# Patient Record
Sex: Female | Born: 1980 | Race: Black or African American | Hispanic: No | Marital: Single | Smoking: Former smoker
Health system: Southern US, Community
[De-identification: ages and names within clinical notes are randomized; demographics above are authoritative.]

## PROBLEM LIST (undated history)

## (undated) ENCOUNTER — Inpatient Hospital Stay (HOSPITAL_COMMUNITY): Payer: Self-pay

## (undated) DIAGNOSIS — F410 Panic disorder [episodic paroxysmal anxiety] without agoraphobia: Secondary | ICD-10-CM

## (undated) DIAGNOSIS — I10 Essential (primary) hypertension: Secondary | ICD-10-CM

## (undated) DIAGNOSIS — J45909 Unspecified asthma, uncomplicated: Secondary | ICD-10-CM

## (undated) DIAGNOSIS — F319 Bipolar disorder, unspecified: Secondary | ICD-10-CM

---
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
## 2002-05-25 ENCOUNTER — Other Ambulatory Visit: Admission: RE | Admit: 2002-05-25 | Discharge: 2002-05-25 | Payer: Self-pay | Admitting: Obstetrics and Gynecology

## 2002-07-29 ENCOUNTER — Emergency Department (HOSPITAL_COMMUNITY): Admission: EM | Admit: 2002-07-29 | Discharge: 2002-07-29 | Payer: Self-pay | Admitting: Emergency Medicine

## 2002-08-10 ENCOUNTER — Emergency Department (HOSPITAL_COMMUNITY): Admission: EM | Admit: 2002-08-10 | Discharge: 2002-08-10 | Payer: Self-pay | Admitting: Emergency Medicine

## 2002-08-10 ENCOUNTER — Encounter: Payer: Self-pay | Admitting: Emergency Medicine

## 2003-02-18 ENCOUNTER — Emergency Department (HOSPITAL_COMMUNITY): Admission: EM | Admit: 2003-02-18 | Discharge: 2003-02-18 | Payer: Self-pay | Admitting: Emergency Medicine

## 2003-09-27 ENCOUNTER — Emergency Department (HOSPITAL_COMMUNITY): Admission: EM | Admit: 2003-09-27 | Discharge: 2003-09-27 | Payer: Self-pay | Admitting: Emergency Medicine

## 2006-02-04 ENCOUNTER — Observation Stay (HOSPITAL_COMMUNITY): Admission: EM | Admit: 2006-02-04 | Discharge: 2006-02-05 | Payer: Self-pay | Admitting: Emergency Medicine

## 2006-02-05 ENCOUNTER — Inpatient Hospital Stay (HOSPITAL_COMMUNITY): Admission: RE | Admit: 2006-02-05 | Discharge: 2006-02-10 | Payer: Self-pay | Admitting: Psychiatry

## 2006-02-06 ENCOUNTER — Ambulatory Visit: Payer: Self-pay | Admitting: Psychiatry

## 2006-04-28 ENCOUNTER — Inpatient Hospital Stay (HOSPITAL_COMMUNITY): Admission: AD | Admit: 2006-04-28 | Discharge: 2006-04-28 | Payer: Self-pay | Admitting: Gynecology

## 2006-06-05 ENCOUNTER — Other Ambulatory Visit: Admission: RE | Admit: 2006-06-05 | Discharge: 2006-06-05 | Payer: Self-pay | Admitting: Obstetrics and Gynecology

## 2006-08-09 ENCOUNTER — Inpatient Hospital Stay (HOSPITAL_COMMUNITY): Admission: AD | Admit: 2006-08-09 | Discharge: 2006-08-09 | Payer: Self-pay | Admitting: Obstetrics and Gynecology

## 2006-11-06 ENCOUNTER — Inpatient Hospital Stay (HOSPITAL_COMMUNITY): Admission: AD | Admit: 2006-11-06 | Discharge: 2006-11-06 | Payer: Self-pay | Admitting: Obstetrics and Gynecology

## 2006-11-12 ENCOUNTER — Inpatient Hospital Stay (HOSPITAL_COMMUNITY): Admission: AD | Admit: 2006-11-12 | Discharge: 2006-11-13 | Payer: Self-pay | Admitting: Obstetrics and Gynecology

## 2006-11-14 ENCOUNTER — Inpatient Hospital Stay (HOSPITAL_COMMUNITY): Admission: AD | Admit: 2006-11-14 | Discharge: 2006-11-17 | Payer: Self-pay | Admitting: Obstetrics and Gynecology

## 2006-11-20 ENCOUNTER — Inpatient Hospital Stay (HOSPITAL_COMMUNITY): Admission: AD | Admit: 2006-11-20 | Discharge: 2006-11-20 | Payer: Self-pay | Admitting: Obstetrics and Gynecology

## 2008-01-17 ENCOUNTER — Emergency Department (HOSPITAL_COMMUNITY): Admission: EM | Admit: 2008-01-17 | Discharge: 2008-01-18 | Payer: Self-pay | Admitting: Emergency Medicine

## 2009-04-05 ENCOUNTER — Emergency Department (HOSPITAL_BASED_OUTPATIENT_CLINIC_OR_DEPARTMENT_OTHER): Admission: EM | Admit: 2009-04-05 | Discharge: 2009-04-05 | Payer: Self-pay | Admitting: Emergency Medicine

## 2009-04-05 ENCOUNTER — Ambulatory Visit: Payer: Self-pay | Admitting: Diagnostic Radiology

## 2009-04-18 ENCOUNTER — Ambulatory Visit: Payer: Self-pay | Admitting: *Deleted

## 2009-04-18 ENCOUNTER — Inpatient Hospital Stay (HOSPITAL_COMMUNITY): Admission: RE | Admit: 2009-04-18 | Discharge: 2009-04-21 | Payer: Self-pay | Admitting: *Deleted

## 2010-03-15 ENCOUNTER — Emergency Department (HOSPITAL_COMMUNITY): Admission: EM | Admit: 2010-03-15 | Discharge: 2010-03-15 | Payer: Self-pay | Admitting: Emergency Medicine

## 2010-12-16 LAB — RAPID STREP SCREEN (MED CTR MEBANE ONLY): Streptococcus, Group A Screen (Direct): NEGATIVE

## 2010-12-16 LAB — STREP A DNA PROBE: Group A Strep Probe: NEGATIVE

## 2011-01-06 LAB — COMPREHENSIVE METABOLIC PANEL
Albumin: 4.3 g/dL (ref 3.5–5.2)
BUN: 11 mg/dL (ref 6–23)
Chloride: 107 mEq/L (ref 96–112)
Creatinine, Ser: 1.08 mg/dL (ref 0.4–1.2)
GFR calc non Af Amer: >60 mL/min (ref >60)
Total Bilirubin: 1.5 mg/dL — ABNORMAL HIGH (ref 0.3–1.2)

## 2011-01-06 LAB — TSH
TSH: 1.136 u[IU]/mL (ref 0.350–4.500)
TSH: 1.498 u[IU]/mL (ref 0.350–4.500)

## 2011-01-06 LAB — CBC
HCT: 40.2 % (ref 36.0–46.0)
MCV: 86 fL (ref 78.0–100.0)
Platelets: 220 10*3/uL (ref 150–400)
WBC: 7.3 10*3/uL (ref 4.0–10.5)

## 2011-01-06 LAB — URINALYSIS, ROUTINE W REFLEX MICROSCOPIC
Ketones, ur: NEGATIVE mg/dL
Nitrite: NEGATIVE
Protein, ur: NEGATIVE mg/dL
Urobilinogen, UA: 1 mg/dL (ref 0.0–1.0)
pH: 6 (ref 5.0–8.0)

## 2011-02-12 NOTE — Discharge Summary (Signed)
NAMEMALORY, [name]:  [id number]   MEDICAL RECORD NO.:  [id number]          PATIENT TYPE:  IPS   LOCATION:  0307                          FACILITY:  [facility]   PHYSICIAN:  Jasmine Pang, M.D. DATE OF BIRTH:  [birth year]   DATE OF ADMISSION:  04/18/2009  DATE OF DISCHARGE:  04/21/2009                               DISCHARGE SUMMARY   IDENTIFICATION:  This is a 30 year old separated African American female  from [place] who was admitted on a voluntary basis on April 18, 2009.   HISTORY OF PRESENT ILLNESS:  The patient was admitted to seek treatment  for depression and anxiety.  She reported worsening depression and  severe anxiety with panic attacks for the past 2 months.  She stated  that her major stressors at this time include job stress, a stress of  being a single parent, her brother's recent diagnosis of brain tumor,  and her father's terminal illness.  She states she recently used  marijuana and alcohol, but this is not a routine for her.  For further  admission information, see psychiatric admission assessment.   PHYSICAL FINDINGS:  There were no acute physical or medical problems  noted.   ADMISSION LABORATORY:  The TSH was within normal limits at 1.498.  Comprehensive metabolic panel was within normal limits except for a  slightly elevated total bilirubin of 1.5 (0.3-1.2) and hers was 1.5.  CBC was grossly within normal limits.   HOSPITAL COURSE:  Upon admission, the patient was started on Ambien 5 mg  p.o. q.h.s. p.r.n. insomnia, may repeat x1 if needed.  She was also  started on Ensure 1 can at 10 a.m., 1400 hours, and 2000 hours.  In  individual sessions, the patient had psychomotor retardation.  Her  speech was soft and slow.  She was depressed and anxious.  Affect was  consistent with mood with no evidence of psychosis or thought disorder.  She was started on Celexa 20 mg p.o. daily also due to difficulty  sleeping on the Ambien, she was  changed to trazodone 50 mg p.o. q.h.s.  p.r.n. insomnia with a may repeat x1 if needed.  As hospitalization  progressed, she was sleeping well, but drowsy in a.m. from her  trazodone.  Trazodone was, therefore, decreased to 25 mg p.o. q.h.s.  with a may repeat x1 if needed.  She talked about her worry about her  stressors, stress of father's terminal illness.  She was upset because  he continues to use alcohol and stays away from home quite a bit.  In  addition to his cirrhosis of the liver, he also has MS.  She planned to  go live with her mother for a while and felt this would be good for her.  On April 21, 2009, sleep was good.  Her mental status had improved.  Mood  was less depressed and less anxious.  Affect was consistent with wound.  There was no suicidal or homicidal ideation.  No thoughts of self  injurious behavior.  No auditory or visual hallucinations.  No paranoia  or delusions.  Thoughts were  logical and goal directed.  Thought  content, no predominant theme.  Cognitive was grossly intact.  Insight  good.  Judgment good.  Impulse control good.  She was having no side  effects on her medications.  She wanted to go home today and was felt to  be safe for discharge.  We are putting her on disability from her job,  which she describes is extremely stressful.  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   DISCHARGE DIAGNOSES:  Axis I:  Depressive disorder, not otherwise  specified.  Axis II:  None.  Axis III:  None.  Axis IV:  Severe (problems with primary support group, problems related  to social environment, occupational problems, other psychosocial  problem, burden of psychiatric illness).  Axis V:  Global assessment of functioning was 50 upon discharge.  Global  assessment of functioning was 35 upon admission.  Global assessment of  functioning was 70-75 highest past year.   DISCHARGE PLAN:  There were no specific activity level or dietary   restrictions.   POST HOSPITAL CARE PLANS:  The patient will see Dr. Dorothe Duncan, her  psychiatrist on June 07, 2009, at 1 p.m.  She will also see Shannon Duncan, licensed clinical social worker on April 27, 2009, at 4 p.m. for  therapy.   DISCHARGE MEDICATIONS:  1. Celexa 20 mg daily.  2. Trazodone 25 mg at bedtime if needed.      Jasmine Pang, M.D.  Electronically Signed     BHS/MEDQ  D:  04/21/2009  T:  04/21/2009  Job:  [id number]

## 2011-02-15 NOTE — Discharge Summary (Signed)
Shannon Duncan, Shannon Duncan              ACCOUNT NO.:  [id number]   MEDICAL RECORD NO.:  [id number]          PATIENT TYPE:  INP   LOCATION:  1424                         FACILITY:  [facility]   PHYSICIAN:  Lonia Blood, M.D.DATE OF BIRTH:  Feb 16, 1981   DATE OF ADMISSION:  02/03/2006  DATE OF DISCHARGE:  02/05/2006                                 DISCHARGE SUMMARY   PRIMARY CARE PHYSICIAN:  Unassigned.   DISCHARGE DIAGNOSES:  1.  Toxic ingestion of Tylenol.  2.  Suicide attempt.  3.  Severe depression requiring inpatient psychiatric hospitalization.   DISCHARGE MEDICATIONS:  1.  Mucomyst 20% solution, 20 mL p.o. q. 4 h x8 total doses to complete a      full 17-dose course.  2.  Protonix 40 mg p.o. daily until Mucomyst therapy discontinued, then      discontinue Protonix.   FOLLOW UP:  The patient should have hepatic function panel obtained every  morning for the next 4 days to assure the patient's LFTs do not become  elevated with her recent history of Tylenol overdose. She should be observed  very closely to assure that she does complete her full 17-dose treatment  with Mucomyst.  Psychiatric disposition will be determined by the psychiatry  service at [facility].   HISTORY OF PRESENT ILLNESS:  Shannon Duncan is a 30 year old female who  has been suffering with significant social stresses lately.  In the late  night hours of Feb 03, 2006, she ingested a total of 17 Tylenol PM tablets in  attempt to end her life.  She became nauseous and began vomiting and alerted  family.  The family brought the patient to the emergency room for  evaluation.   HOSPITAL COURSE:  The patient was admitted to the acute unit at [facility] for medical management of acute Tylenol toxicity.  At time of  admission, LFTs were unremarkable.  Acetaminophen level was 80 at the time  of admission.  Decision was made to treat the patient with a full course of  Mucomyst therapy.  This  was initiated with the first dose in the emergency  room.  The patient is to complete a full 17-course dose as detailed above.  The patient was monitored in house for 24 hours.  During this time, LFTs  remained normal with AST range from 14 to 17 and ALT ranging between 16 and  19.  Total bilirubin peaked at 2.1 on the day of discharge.  The patient was  clinically stable.  Physical exam failed to reveal any tenderness over the  liver.  The patient was able to tolerate a regular diet.   The patient was counseled extensively as to the deleterious affects of  Tylenol toxicity.  She was advised that, even though she is cleared for  discharge to a psychiatric facility, that she should avoid use of all  Tylenol-containing products for a minimum of 6 months and then to adhere  strictly to the dosing recommendations on the bottle's label.   Psychiatry was consulted, and the patient was evaluated in house.  She  was  felt to be an appropriate candidate for inpatient psychiatric therapy.  On  Feb 05, 2006, the patient was deemed medically stable for transfer to  [facility] under the stipulation that she continue to have LFTs  checked there as well as Mucomyst treatment.  This was confirmed with the  help of social work, and arrangements were made for the patient to be  discharged.   At the time of discharge, the patient's vital signs are stable; she is  afebrile.  She is tolerating p.o. without difficulty.  AST 17, ALT 17,  alkaline phosphatase 62, total bilirubin 2.1, and coags are normal.  It  should be noted that urine pregnancy test was negative during hospital stay,  and Tylenol level was noted to be less than therapeutic at the time of  discharge.      Lonia Blood, M.D.  Electronically Signed     JTM/MEDQ  D:  02/05/2006  T:  02/05/2006  Job:  [id number]   cc:   Behavioral Health

## 2011-02-15 NOTE — Discharge Summary (Signed)
Shannon Duncan, Shannon Duncan              ACCOUNT NO.:  [id number]   MEDICAL RECORD NO.:  [id number]          PATIENT TYPE:  IPS   LOCATION:  0303                          [facility]ACILITY:  [facility]   PHYSICIAN:  Geoffery Lyons, M.D.      DATE OF BIRTH:  1981/09/24   DATE OF ADMISSION:  02/05/2006  DATE OF DISCHARGE:  02/10/2006                                 DISCHARGE SUMMARY   CHIEF COMPLAINT AND PRESENT ILLNESS:  This was the first admission to [facility] for this 30 year old female.  She became agitated and  took too many Tylenol.  Felt out of control.  Multiple stressors.  Father  lives in the street, terminal cancer, signed over medical power of attorney  to the patient.  Has been married for 10 days and the husband is in jail.  Joined the [employer] and has been called off to [place].  History of severe  depression since childhood.  Endorsed two weeks of anxiety.  Severe crying  spells, feeling panicky.  Severely depressed, feeling out of control.   PAST PSYCHIATRIC HISTORY:  No prior treatment.  Depression and agitation  since childhood.  Tried to hurt herself at age 84.  Episodic panic and  depression.   ALCOHOL/DRUG HISTORY:  Denies the active use of any substances.   MEDICAL HISTORY:  Exacerbated asthma.   MEDICATIONS:  None.   PHYSICAL EXAMINATION:  Performed and failed to show any acute findings.   LABORATORY DATA:  Liver enzymes with SGOT 17, SGPT 17, alkaline phosphatase  62, bilirubin 2.1.   MENTAL STATUS EXAM:  Fully alert, pleasant, cooperative female.  Tearful.  Speech soft tone but normal rate, tempo and production.  Mood depressed.  Affect depressed.  Thought processes logical, coherent and relevant.  Dealing with the events, the stressors, the overdose.  No active suicidal or  homicidal ideation.  No delusions.  No hallucinations.  Cognition was well-  preserved.   DIAGNOSES:  AXIS I:  Major depression.  Anxiety disorder not otherwise  specified.  AXIS II:   No diagnosis.  AXIS III:  Status post acetaminophen overdose.  AXIS IV:  Moderate.  AXIS V:  GAF upon admission 30; highest GAF in the last year 70.   HOSPITAL COURSE:  She was admitted.  She was started in individual and group  psychotherapy.  She endorsed increased signs and symptoms of depression, has  been going on for a while.  Was worse, responding to the father being very  ill, terminal, never had a relationship with him and felt that she was  running out of time.  Scared as she did not want to try to hurt herself  again.  On Feb 07, 2006, continued to have a hard time.  Endorsed some  persistent depression, overwhelmed with the way she was feeling.  Mood  depressed.  Affect depressed.  Irritability, mood fluctuation.  Family  history of bipolar disorder.  By May 12th, there was some improvement.  On  May 14th, she was in full contact with reality.  There were no active  suicidal or homicidal ideation.  No hallucinations.  No delusions.  Willing  to pursue further outpatient treatment.  Was going to work on Pharmacologist.  Was going to abstain from any substances and she was going to continue her  medication.  Given the fact that the depression started early on, strong  family history of bipolar disorder and her admitting to episodes of mood  fluctuations towards agitation and the experience of being out of control,  it was considered that she had a mood disorder not otherwise specified.   DISCHARGE DIAGNOSES:  AXIS I:  Mood disorder not otherwise specified.  AXIS II:  No diagnosis.  AXIS III:  Status post acetaminophen overdose.  AXIS IV:  Moderate.  AXIS V:  GAF upon discharge 50.   DISCHARGE MEDICATIONS:  1.  Zoloft 50 mg per day.  2.  Depakote ER 250 mg, 1 twice a day and 1 at bedtime.  3.  Protonix 40 mg per day.   FOLLOWUP:  __________ at [facility].      Geoffery Lyons, M.D.  Electronically Signed     IL/MEDQ  D:  02/27/2006  T:   02/27/2006  Job:  [id number]

## 2011-02-15 NOTE — Discharge Summary (Signed)
NAMEEULANDA, Shannon Duncan              ACCOUNT NO.:  [id number]   MEDICAL RECORD NO.:  [id number]          PATIENT TYPE:  INP   LOCATION:  9155                          [facility]ILITY:  [facility]   PHYSICIAN:  Naima A. Dillard, M.D. DATE OF BIRTH:  1981-06-21   DATE OF ADMISSION:  11/12/2006  DATE OF DISCHARGE:  11/13/2006                               DISCHARGE SUMMARY   ADMISSION DIAGNOSIS:  1. Intrauterine pregnancy at 36-1/7 weeks.  2. Pregnancy induced hypertension.  3. Prolonged fetal heart rate deceleration.   DISCHARGE DIAGNOSES:  1. Intrauterine pregnancy at 36-1/7 weeks.  2. Pregnancy induced hypertension.  3. Prolonged fetal heart rate deceleration.  4. Reassuring fetal status.   HOSPITAL PROCEDURES:  1. Electronic fetal monitoring.  2. Ultrasound.   HOSPITAL COURSE:  The patient was admitted for evaluation of PIH after  having elevated blood pressure at 160/100 in the office. Labs were done  and were normal. During her evaluation, she had a fetal heart rate decel  lasting four minutes with reactive tracing after the decel, so she was  kept overnight for observation and 24-hour urine was begun.  She had  normal PIH labs and the ultrasound showed a BPP of 8/8 with growth at  80% and anatomy was normal with an AFI of 13.3.  She was deemed to have  received the full benefit of hospital stay and was discharged home.   DISCHARGE MEDICATIONS:  Prenatal vitamins.   DISCHARGE INSTRUCTIONS:  The patient will remain on bedrest and complete  the 24-hour urine and return that to the hospital tonight.   DISCHARGE LABS:  Sodium 138, potassium 3.3, creatinine 0.63, AST 17, ALT  9, LDH 148, uric acid 4.1, platelet count 193, hemoglobin 10.  24 hour  urine pending.   DISCHARGE FOLLOW-UP:  Monday at 8:30 a.m. with Dr. Normand Sloop or p.r.n.   CONDITION ON DISCHARGE:  Good.      Marie L. Williams, C.N.M.      Naima A. Normand Sloop, M.D.  Electronically Signed    MLW/MEDQ  D:  11/13/2006  T:   11/13/2006  Job:  [id number]

## 2011-02-15 NOTE — H&P (Signed)
NAMEVENDELA, TROUNG              ACCOUNT NO.:  [id number]   MEDICAL RECORD NO.:  [id number]          PATIENT TYPE:  INP   LOCATION:  9164                          [facility]ILITY:  [facility]   PHYSICIAN:  Osborn Coho, M.D.   DATE OF BIRTH:  1981-09-29   DATE OF ADMISSION:  11/14/2006  DATE OF DISCHARGE:                              HISTORY & PHYSICAL   Ms. Drewry is a 30 year old gravida 2, para 0, 0-1-0, at 36-4/7 weeks,  who presented for induction secondary to preeclampsia and PIH, with a 24-  hour urine protein of 361 mg this week.  Her previous urine was  approximately 252 mg.   Pregnancy has been remarkable for:  1. Patient is bipolar but on no medication.  2. Asthma.  3. Status post suicide attempt in May, 2007.  4. Group B strep negative.  5. Positive sickle cell trait.  6. History of Chlamydia.  7. History of abnormal Pap.   PRENATAL LABS:  Blood type is O+.  Rh antibody negative.  VDRL  nonreactive.  Rubella titer positive.  Hepatitis B surface antigen  negative.  Sickle cell test was positive.  Cystic fibrosis testing was  negative.  HIV was nonreactive.  GC and Chlamydia cultures were negative  in the first trimester and the third trimester.  Pap smear was normal in  September, 2007.  Hemoglobin upon entering the practice was 13.5.  It  was 11 at 27 weeks.  Group B strep culture was negative at 36 weeks.  Patient had 24-hour urine done this week that showed elevated protein in  the urine.  First trimester nuchal translucency was normal.  She  declined first trimester serum screen.  Quadruple screen was done.  It  was normal.  Her Glucola was elevated at 135.  She had a three-hour GGT  that was normal.  EDC of December 10, 2006 was established by last  menstrual period and was in agreement with ultrasound at approximately  13 weeks.   HISTORY OF PRESENT PREGNANCY:  Patient entered care at approximately 11  weeks.  She had just recently been discharged from the [employer] prior to  deployment.  She had a suicide attempt when deployment seemed to be  inevitable.  She had a Tylenol P.M. overdose.  She also has a tumultuous  relationship with the father of the baby.  Liver function tests and  comprehensive metabolic tests were done in the first trimester secondary  to the history of the Tylenol overdose.  These were normal.  Patient had  a normal nuchal translucency scan at 13 weeks.  Hemoglobin  electrophoresis was done following the positive sickle cell test that  showed a confirmatory sickle cell trait.  She had a quadruple screen  that was normal.  She saw a counselor at approximately 16 weeks.  She  was seen for a yeast infection at 17 weeks.  She did have some cramping  at 19 weeks.  She also had an ultrasound that showed a left ventricular  intracardiac focus.  Adjusted risk of Down's syndrome was 1.057.  She  was sent to the headache  wellness center because of her persistent  headaches.  She also had a stomach virus at 23 weeks.  She had a Glucola  that was elevated at 135.  A three-hour GGT was normal.  She had an  ultrasound at 33 weeks showing normal growth and cultures.  She did have  an elevated blood pressure at 35 weeks and was sent to the maternity  admissions unit for this evaluation.  A 24-hour urine was done, which  was normal.  She then was seen again on the 13th of this month with a  blood pressure of 160/100.  A 24-hour urine was sent.  She was noted to  have 361 mg of protein on that specimen.  She is therefore to be  admitted for induction tonight.   OBSTETRICAL HISTORY:  In 2001, she had a termination of pregnancy in the  first trimester.   PAST MEDICAL HISTORY:  She was previously on oral contraceptives and  Depo.  She had an abnormal Pap in 2001 that had a biopsy done.  She was  treated in February, 2007 for Chlamydia and gonorrhea.  She also had  Trichomonas in 2003.  She reports the usual childhood illnesses.  She  does have asthma and  uses albuterol as needed.  She has had a history of  bipolar disease in the past.  She was on Depakote and Zoloft.  Dr. Dub Mikes  is her psychiatrist, but she stopped her medication in July, 2007.  She  had a suicide attempt in May, 2007 due to the news over her pending  deployment to [place] while she was in the service as well as her dad's  terminal illness.   She has no known medication allergies.   FAMILY HISTORY:  Her father and her aunt have hypertension.  Her mother  has anemia.  Her father has cirrhosis.  Her cousin had seizures.  Her  cousin has stomach cancer.  Bipolar disease also runs on her dad's side.  Her father has some addictive behaviors.   GENETIC HISTORY:  Unremarkable.   SOCIAL HISTORY:  Patient is single.  The father of the baby is not  involved.  The patient has three years of college.  She has also been  recently discharged from the [employer].  She is currently unemployed.  She is  African-American female and of the [place] faith.  She was recently  followed by the certified nurse midwife service but then was recently  transferred to the physician's service secondary to preeclampsia.  She  denies any alcohol, drug, or tobacco use during this pregnancy.   PHYSICAL EXAMINATION:  VITAL SIGNS:  Stable.  HEENT:  Within normal limits.  LUNGS:  Breath sounds are clear.  HEART:  Regular rate and rhythm without murmur.  BREASTS:  Soft and nontender.  ABDOMEN:  Fundal height approximately 36 cm.  Estimated fetal weight is  6 pounds.  Uterine contractions are irregular and mild.  EXTREMITIES:  Deep tendon reflexes are 2+ without clonus.  There is  trace edema noted.  PELVIC:  Cervix is closed, 50% vertex at a -2 by RN exam.  Vertex is  verified by bedside ultrasound.  Fetal heart rates are reactive with no  decelerations.   ASSESSMENT:  1. Intrauterine pregnancy at 36-4/7 weeks.  2. Pregnancy-induced hypertension, preeclampsia.   PLAN: 1. Enter birth suite for consult  with Dr. Su Hilt as attending      physician.  2. Cervidil planned.  3. Pain medications p.r.n.  4.  MD will follow.  5. PIH labs with admission.      Renaldo Reel Emilee Hero, C.N.M.      Osborn Coho, M.D.  Electronically Signed    VLL/MEDQ  D:  11/15/2006  T:  11/15/2006  Job:  [id number]

## 2011-02-15 NOTE — H&P (Signed)
[name], HEADINGS              ACCOUNT NO.:  [id number]   MEDICAL RECORD NO.:  [id number]          PATIENT TYPE:  INP   LOCATION:  1424                         FACILITY:  [facility]   PHYSICIAN:  Elliot Cousin, M.D.    DATE OF BIRTH:  1980-11-07   DATE OF ADMISSION:  02/03/2006  DATE OF DISCHARGE:                                HISTORY & PHYSICAL   PRIMARY CARE PHYSICIAN:  The patient is unassigned.   CHIEF COMPLAINT:  I took Tylenol PM.   HISTORY OF PRESENT ILLNESS:  The patient is a 30 year old lady with no  significant past medical history who presents to the emergency department  after having intentionally ingested 17 Tylenol PM tablets on the night of  Feb 03, 2006.  The patient says that she has been somewhat depressed and has  been stressed out because of work and some family issues.  The patient  declines to elaborate further.  Between 8:30 p.m. and 9 p.m. on the night of  Feb 03, 2006, she ingested 17 Tylenol PM tablets.  She counted as she took  each one.  Over the course of 5 minutes she took all 17 pills.  She readily  admits that she was trying to take her life.  After approximately 1 hour  after the ingestion, she developed some nausea and vomiting.  When the  vomiting occurred she called her cousin and explained to her what had  happened.  Her cousin subsequently brought her to the emergency department.   During the evaluation in the emergency department by Dr. Adriana Simas, the patient  was noted to be hemodynamically stable and in no acute distress.  Her lab  data are significant for an acetaminophen level of 80.2 (10-30 within normal  limits), an alcohol level of less than 5, and liver transaminases which are  within normal limits.  Her urine drug screen was positive for THC.  The  patient will be admitted for further evaluation and management.   PAST MEDICAL HISTORY:  1.  No significant past medical history.  The patient denies any prior      history of a depression  diagnosis.  No prior history of suicide      attempts.  No history of hypertension, diabetes, kidney disease, stroke,      or cardiac disease.  No significant childhood illnesses.  2.  Medications:  None prescribed.  3.  Allergies:  No known drug allergies.   SOCIAL HISTORY:  The patient is single.  [place]  She has no children.  She lives alone.  She quit her job at [employer] last week.  She smokes a-half a pack to one pack of cigarettes per day  and has been doing so for 3 years.  She smokes marijuana at least six or  seven times per week.  She denies alcohol use.  She denies any other illicit  drug use.  She is a Engineer, agricultural.   FAMILY HISTORY:  Her mother is 87 years of age and healthy.  Her father is  47 years of  age and has hepatitis C, cirrhosis, and prostate cancer.  She  has two brothers who are healthy.   REVIEW OF SYSTEMS:  The patient's review of systems is positive for sadness,  crying, anxiety, and feelings of hopelessness.  Her review of systems is  also positive for an occasional cough with brown and yellow sputum.  Otherwise, review of systems is negative.   EXAMINATION:  VITAL SIGNS:  Temperature 98.4, blood pressure 113/63, pulse  initially 111 but now 71, respiratory rate 18, oxygen saturation 98% on room  air.  GENERAL:  The patient is a 30 year old average-framed African-American woman  who is currently lying in bed in no acute distress.  HEENT:  Head is normocephalic, nontraumatic.  Pupils equal, round, and  reactive to light.  Extraocular movements are intact.  Conjunctivae are  clear, sclerae are white.  Tympanic membranes are clear bilaterally.  Nasal  mucosa is mildly dry.  No sinus tenderness.  Oropharynx reveals mildly dry  mucous membranes.  No posterior exudates or erythema.  NECK:  Supple.  No adenopathy, no thyromegaly, no bruit, no JVD.  LUNGS:  Clear to auscultation bilaterally.  HEART:  S1, S2, with no  murmurs, rubs, or gallops.  ABDOMEN:  Positive bowel sounds, soft, nontender, nondistended.  No  hepatosplenomegaly, no masses palpated.  RECTAL AND GENITOURINARY:  Deferred.  EXTREMITIES:  Pedal pulses are 2+ bilaterally.  No pretibial edema, no pedal  edema.  NEUROLOGIC/PSYCHOLOGICAL:  The patient is alert and oriented x3.  Cranial  nerves II-XII are intact.  Strength is 5/5 throughout.  Sensation is intact.  Affect is flat and somewhat sad.  She does develop tears as she states why  she tried to commit suicide.  However, she was not forthcoming about  specifics regarding why she quit her job.   ADMISSION LABORATORY:  Acetaminophen level 80.2.  Alcohol less than 5.  Urine drug screen positive for THC.  PT 15.1, INR 1.2, PTT 33.  WBC 5.2,  hemoglobin 13.8, hematocrit 40.1, MCV 83.8, platelets 245.  Sodium 137,  potassium 3.6, chloride 106, CO2 27, glucose 93, BUN 11, creatinine 1.0,  calcium 9.5, total protein 6.9, albumin 4.1, AST 25, ALT 24, alkaline  phosphatase 87, total bilirubin 0.9.   ASSESSMENT:  1.  Suicide attempt with an intentional ingestion of Tylenol PM.  The      obvious concern is acetaminophen toxicity.  The patient is currently      alert and oriented and hemodynamically stable.  Her liver transaminases      are within normal limits currently.  Her acetaminophen level is quite      elevated at 80.2.  2.  Depression.  The patient has never been diagnosed with depression nor      has she sought treatment for her symptoms in the past.  3.  Tobacco and marijuana abuse.   PLAN:  1.  The patient was given one dose of Mucomyst by the emergency department      physician.  She was also treated with at least a liter of IV fluids      during her stay in the emergency department.  2.  Will continue Mucomyst therapy x17 doses per pharmacy.  3.  Continue maintenance IV fluids.  Will start a full liquid diet for now     and then advance as tolerated.  Will assess for signs of  GI discomfort.  4.  Will start prophylactic treatment with Protonix 40 mg daily.  5.  Nicotine  replacement therapy with a nicotine patch 21 mg.  6.  Will add as needed Xanax 0.25 mg q.8h.  7.  Will consult psychiatry for recommendations regarding pharmacological      therapy for depression.  8.  The patient will be closely monitored with a 24-hour sitter.  Suicide      precautions.  9.  Will assess the patient's TSH to rule out thyroid disease.  10. Will monitor and assess the acetaminophen levels and liver transaminases      q.6h. x24 hours and daily thereafter.  11. Will check an urine hCG.  12. Tobacco and drug cessation counseling.  13. Supportive care.      Elliot Cousin, M.D.  Electronically Signed     DF/MEDQ  D:  02/04/2006  T:  02/04/2006  Job:  [id number]

## 2011-02-15 NOTE — H&P (Signed)
NAMEASHIMA, SHRAKE              ACCOUNT NO.:  [id number]   MEDICAL RECORD NO.:  [id number]          PATIENT TYPE:  INP   LOCATION:  9155                          FACILITY:  [facility]   PHYSICIAN:  Naima A. Dillard, M.D. DATE OF BIRTH:  1981-09-15   DATE OF ADMISSION:  11/12/2006  DATE OF DISCHARGE:                              HISTORY & PHYSICAL   [name] is a 30 year old gravida 2, para 0-0-1-0, at 36-1/7 weeks,  EDD December 10, 2006, who presents for [facility] evaluation today with elevated  blood pressures from the office of [facility] and a mild constant headache.  She reports positive fetal movement, irregular contractions that are  becoming stronger and more regular.  No vaginal bleeding.  No rupture of  membranes.  No nausea or vomiting.  No visual changes or epigastric  pain.  Her pregnancy has been followed by the CNM. service at [facility] and  is remarkable for  1. Bipolar disease, no meds.  2. History of suicide attempt in May 2007.  3. Asthma.  4. History of abnormal Pap.  5. History of chlamydia.  6. First trimester BV.  7. Positive sickle cell trait.  8. Group B strep negative.   This patient began prenatal care at the office of [facility] on May 27, 2006, at approximately 11 weeks' gestation, [facility] determined by dates and  confirmed with ultrasound.  Her pregnancy has been essentially  unremarkable.  She has been size equal to dates. She has been  normotensive until her appointment on November 06, 2006.  She was  evaluated at that time and PIH labs and urine was negative. She presents  today with the same symptoms.   Prenatal lab work on May 27, 2006, revealed hemoglobin and hematocrit  13.5 and 38.7, platelets 201,000.  Blood type Rh O+, antibody screen  negative, sickle cell trait positive.  VDRL nonreactive, rubella immune,  hepatitis B surface antigen negative, HIV nonreactive.  CF testing  negative.  Pap smear within normal limits.  GC and chlamydia negative.  At 28 weeks, one  hour glucose challenge elevated, 3-hour GTT within  normal limits at 36 weeks. Culture of the vaginal tract is negative for  group B strep.   The patient has no known drug allergies.   She denies the use of tobacco, alcohol or illicit drugs.  Her height is  5 feet 8-1/2 inches.  Her pre-gravid weight is 110 pounds.  Her current  weight is 156 pounds.   OB HISTORY:  In 2001, the patient had a first trimester elective AB with  no complications.  This is her second and current pregnancy.   MEDICAL HISTORY:  In 2001, the patient had an abnormal Pap smear,  colposcopy, and biopsy.  Since then, Pap smears have been within normal  limits.  The patient was treated for chlamydia in February 2007. History  of asthma. History of bipolar disease, the patient has stopped all  medications and taken none throughout her pregnancy. She has gone to see  a Veterinary surgeon at [facility] throughout her pregnancy.  She also has  a history of  suicide attempt in May 2007.   FAMILY HISTORY:  The patient's father and maternal aunt with a history  of chronic hypertension.  The patient's mother with anemia. The  patient's father with cirrhosis of the liver.   GENETIC HISTORY:  There is no genetic history of familial or chromosomal  disorders, children that were born with birth defects, or any that died  in infancy.   SOCIAL HISTORY:  Ms. Grimley is a single African American female.  She is  [place] in her faith.   REVIEW OF SYSTEMS:  As described above.   She presents with PIH, fetal heart rate deceleration x4 minutes was  noted on monitor.  She is to be admitted with elevated blood pressures  for monitoring.   PHYSICAL EXAMINATION:  VITAL SIGNS:  Temperature 98.9, pulse 70,  respirations 20, blood pressure 140/90, 153/98, 153/86, 152/90.  HEENT:  Unremarkable.  HEART:  Regular rate and rhythm.  LUNGS:  Clear.  ABDOMEN:  Gravid in contour.  Uterine fundus is noted to extend 36 cm  above the level of  the pubic symphysis.  Leopold's maneuvers finds the  infant to be in a longitudinal lie, cephalic presentation and the  estimated fetal weight is 5 pounds. The baseline of the fetal heart rate  monitor is 140s with average long term variability, reactivity is  present.  A four minute prolonged deceleration was noted down to 60s  which returned to baseline and reactive following decelerations.  The  patient is contracting every 2-4 minutes.  GU:  Digital exam of the cervix finds it to be 1 cm dilated, 50% effaced  with a cephalic presenting part at -2 station and ballotable.  EXTREMITIES:  Show no pathologic edema.  DTRs were 1+ with no clonus.  There is no calf tenderness noted bilaterally.   The patient's CBC found her WBC 6.9, hemoglobin 10.7, hematocrit 30.6,  and platelets 201,000.  Her PIH labs were all within normal limits.   ASSESSMENT:  Intrauterine pregnancy at 36-1/7 weeks, PIH, prolonged  fetal heart rate decelerations.   PLAN:  Admit per Dr. Jaymes Graff, collect 24-hour urine, with orders  as written.      Rica Koyanagi, C.N.M.      Naima A. Normand Sloop, M.D.  Electronically Signed    SDM/MEDQ  D:  11/12/2006  T:  11/12/2006  Job:  [id number]

## 2011-04-04 ENCOUNTER — Emergency Department (HOSPITAL_COMMUNITY)
Admission: EM | Admit: 2011-04-04 | Discharge: 2011-04-04 | Disposition: A | Discharge status: Home / Self Care | Payer: Self-pay | Attending: Emergency Medicine | Admitting: Emergency Medicine

## 2011-04-04 ENCOUNTER — Emergency Department (HOSPITAL_COMMUNITY): Payer: Self-pay

## 2011-04-04 DIAGNOSIS — R51 Headache: Secondary | ICD-10-CM | POA: Insufficient documentation

## 2011-04-04 DIAGNOSIS — R0602 Shortness of breath: Secondary | ICD-10-CM | POA: Insufficient documentation

## 2011-04-04 DIAGNOSIS — R079 Chest pain, unspecified: Secondary | ICD-10-CM | POA: Insufficient documentation

## 2012-07-17 ENCOUNTER — Inpatient Hospital Stay (HOSPITAL_COMMUNITY)
Admission: AD | Admit: 2012-07-17 | Discharge: 2012-07-17 | Disposition: A | Discharge status: Home / Self Care | Payer: Self-pay | Source: Ambulatory Visit | Attending: Obstetrics & Gynecology | Admitting: Obstetrics & Gynecology

## 2012-07-17 ENCOUNTER — Encounter (HOSPITAL_COMMUNITY): Payer: Self-pay | Admitting: Family

## 2012-07-17 ENCOUNTER — Inpatient Hospital Stay (HOSPITAL_COMMUNITY): Payer: Self-pay

## 2012-07-17 DIAGNOSIS — R1032 Left lower quadrant pain: Secondary | ICD-10-CM | POA: Insufficient documentation

## 2012-07-17 DIAGNOSIS — O26899 Other specified pregnancy related conditions, unspecified trimester: Secondary | ICD-10-CM

## 2012-07-17 DIAGNOSIS — O99891 Other specified diseases and conditions complicating pregnancy: Secondary | ICD-10-CM | POA: Insufficient documentation

## 2012-07-17 DIAGNOSIS — R109 Unspecified abdominal pain: Secondary | ICD-10-CM

## 2012-07-17 LAB — URINALYSIS, ROUTINE W REFLEX MICROSCOPIC
Bilirubin Urine: NEGATIVE
Glucose, UA: NEGATIVE mg/dL
Hgb urine dipstick: NEGATIVE
Specific Gravity, Urine: 1.02 (ref 1.005–1.030)
pH: 6 (ref 5.0–8.0)

## 2012-07-17 LAB — WET PREP, GENITAL
Trich, Wet Prep: NONE SEEN
Yeast Wet Prep HPF POC: NONE SEEN

## 2012-07-17 LAB — CBC
HCT: 34.8 % — ABNORMAL LOW (ref 36.0–46.0)
MCV: 81.5 fL (ref 78.0–100.0)
RBC: 4.27 MIL/uL (ref 3.87–5.11)
WBC: 6.6 10*3/uL (ref 4.0–10.5)

## 2012-07-17 LAB — POCT PREGNANCY, URINE: Preg Test, Ur: POSITIVE — AB

## 2012-07-17 LAB — HCG, QUANTITATIVE, PREGNANCY: hCG, Beta Chain, Quant, S: 258 m[IU]/mL — ABNORMAL HIGH (ref <5)

## 2012-07-17 NOTE — MAU Provider Note (Signed)
Attestation of Attending Supervision of Advanced Practitioner (CNM/NP): Evaluation and management procedures were performed by the Advanced Practitioner under my supervision and collaboration.  I have reviewed the Advanced Practitioner's note and chart, and I agree with the management and plan.  HARRAWAY-SMITH, Ermina Oberman 9:11 PM     

## 2012-07-17 NOTE — MAU Note (Signed)
Cramping in low abd started about a wk ago, can't straighten up because it is so bad.  Light bleeding noted when wipes- for past wk. Reports 2 cycles in Sept. (5th and 21st)

## 2012-07-17 NOTE — MAU Note (Signed)
Pt states last Thursday she thought period was early; started bleeding like a normal period for about 2 days. Now when she wipes she is seeing pinkish blood.  Her LMP was 9/21; pt said she bled for 3 days then.

## 2012-07-17 NOTE — MAU Provider Note (Signed)
History     CSN: [id number]  Arrival date and time: 07/17/12 1709   First Provider Initiated Contact with Patient 07/17/12 1826      Chief Complaint  Patient presents with  . Abdominal Pain  . Vaginal Bleeding   HPI Pt is pregnant and  presents with left lower abdominal pain and bleeding when she wipes.  Pt's LNMP was 06/20/2012. She bled again on 10/11 which was like a normal period for 2 days then has had pinkish blood  Pt thought she might had a UTI but did not think she was pregnant.  Pt denies other UTI symptoms, dysuria or frequency.  Pt had Implanon removed in June 2013. This is an unplanned pregnancy.  No past medical history on file.  No past surgical history on file.  No family history on file.  History  Substance Use Topics  . Smoking status: Not on file  . Smokeless tobacco: Not on file  . Alcohol Use: Not on file    Allergies: No Known Allergies  No prescriptions prior to admission    Review of Systems  Constitutional: Negative for fever and chills.  Gastrointestinal: Positive for abdominal pain. Negative for diarrhea and constipation.  Genitourinary: Negative for dysuria, urgency and frequency.   Physical Exam   Blood pressure 130/57, pulse 82, temperature 98 F (36.7 C), temperature source Oral, resp. rate 20, height 5\' 8"  (1.727 m), weight 59.875 kg (132 lb), last menstrual period 06/20/2012.  Physical Exam  Nursing note and vitals reviewed. Constitutional: She appears well-developed and well-nourished. No distress.  HENT:  Head: Normocephalic.  Eyes: Pupils are equal, round, and reactive to light.  Neck: Normal range of motion. Neck supple.  Cardiovascular: Normal rate.   Respiratory: Effort normal.  GI: Soft. There is tenderness.       Tender bilateral lower quadrants more on left than right; no rebound  Genitourinary:       Small amount of white discharge in vault; cervix clean without CMT;  uterus slightly tender; mild bilateral adnexal  tenderness; no rebound  Musculoskeletal: Normal range of motion.  Neurological: She is alert.  Skin: Skin is warm and dry.  Psychiatric:       tearful    MAU Course  Procedures Results for orders placed during the hospital encounter of 07/17/12 (from the past 24 hour(s))  URINALYSIS, ROUTINE W REFLEX MICROSCOPIC     Status: Normal   Collection Time   07/17/12  5:25 PM      Component Value Range   Color, Urine YELLOW  YELLOW   APPearance CLEAR  CLEAR   Specific Gravity, Urine 1.020  1.005 - 1.030   pH 6.0  5.0 - 8.0   Glucose, UA NEGATIVE  NEGATIVE mg/dL   Hgb urine dipstick NEGATIVE  NEGATIVE   Bilirubin Urine NEGATIVE  NEGATIVE   Ketones, ur NEGATIVE  NEGATIVE mg/dL   Protein, ur NEGATIVE  NEGATIVE mg/dL   Urobilinogen, UA 0.2  0.0 - 1.0 mg/dL   Nitrite NEGATIVE  NEGATIVE   Leukocytes, UA NEGATIVE  NEGATIVE  POCT PREGNANCY, URINE     Status: Abnormal   Collection Time   07/17/12  5:41 PM      Component Value Range   Preg Test, Ur POSITIVE (*) NEGATIVE  WET PREP, GENITAL     Status: Abnormal   Collection Time   07/17/12  6:32 PM      Component Value Range   Yeast Wet Prep HPF POC NONE SEEN  NONE  SEEN   Trich, Wet Prep NONE SEEN  NONE SEEN   Clue Cells Wet Prep HPF POC FEW (*) NONE SEEN   WBC, Wet Prep HPF POC FEW (*) NONE SEEN  HCG, QUANTITATIVE, PREGNANCY     Status: Abnormal   Collection Time   07/17/12  6:40 PM      Component Value Range   hCG, Beta Chain, Quant, S 258 (*) <5 mIU/mL  CBC     Status: Abnormal   Collection Time   07/17/12  6:41 PM      Component Value Range   WBC 6.6  4.0 - 10.5 K/uL   RBC 4.27  3.87 - 5.11 MIL/uL   Hemoglobin 12.0  12.0 - 15.0 g/dL   HCT 16.1 (*) 09.6 - 04.5 %   MCV 81.5  78.0 - 100.0 fL   MCH 28.1  26.0 - 34.0 pg   MCHC 34.5  30.0 - 36.0 g/dL   RDW 40.9  81.1 - 91.4 %   Platelets 214  150 - 400 K/uL  GC/Chlamydia pending Care transferred to Nolene Bernheim, NP  Clinical Data: Bleeding, early pregnancy, beta HCG 258    OBSTETRIC <14 WK [place] AND TRANSVAGINAL OB US  Technique: Both transabdominal and transvaginal ultrasound  examinations were performed for complete evaluation of the  gestation as well as the maternal uterus, adnexal regions, and  pelvic cul-de-sac. Transvaginal technique was performed to assess  early pregnancy.  Comparison: None.  Intrauterine gestational sac: A tiny gestational sac is possible,  although equivocal.  Yolk sac: Not visualized.  Embryo: Not visualized.  MSD: 1.5 mm 4 w 4 d  [place] EDC: 03/22/2013  Maternal uterus/adnexae:  No subchronic hemorrhage.  Right ovary is within normal limits, measuring 2.4 x 1.7 x 2.0 cm.  Left ovary is within normal limits, measuring 3.4 x 2.5 x 2.7 cm,  and is notable for a 1.5 x 1.1 x 1.1 cm corpus luteal cyst.  No free fluid.  IMPRESSION:  Possible early intrauterine gestational sac, equivocal. If this  reflects a true finding, the estimated gestational age is 4 weeks 4  days by mean sac diameter.  While early normal IUP is favored, early abnormal IUP or  nonvisualized ectopic pregnancy remain theoretically possible.  Serial beta HCG with follow-up pelvic ultrasound in 14 days is  suggested.   Assessment and Plan  Abdominal pain in pregnancy  Plan  Ectopic precautions discussed with client Return on Monday for repeat labs - quant.  Client states she has to work until 4 pm. Return sooner for severe pain or bleeding.  LINEBERRY,SUSAN 07/17/2012, 7:49 PM

## 2012-07-18 LAB — GC/CHLAMYDIA PROBE AMP, GENITAL: GC Probe Amp, Genital: NEGATIVE

## 2012-09-24 ENCOUNTER — Encounter: Payer: Self-pay | Admitting: Obstetrics and Gynecology

## 2013-05-22 ENCOUNTER — Encounter (HOSPITAL_COMMUNITY): Payer: Self-pay | Admitting: *Deleted

## 2014-02-27 IMAGING — US US OB TRANSVAGINAL
1 series · 13 of 28 positions shown · non-contrast
Comparison: None.

CLINICAL DATA: Bleeding, early pregnancy, beta HCG 258

OBSTETRIC <14 WK US AND TRANSVAGINAL OB US
TECHNIQUE: Both transabdominal and transvaginal ultrasound
examinations were performed for complete evaluation of the
gestation as well as the maternal uterus, adnexal regions, and
pelvic cul-de-sac.  Transvaginal technique was performed to assess
early pregnancy.

[Series 1: us ob comp less 14 wks · 38 acquisitions, 13 frames shown]
[im 2/38]
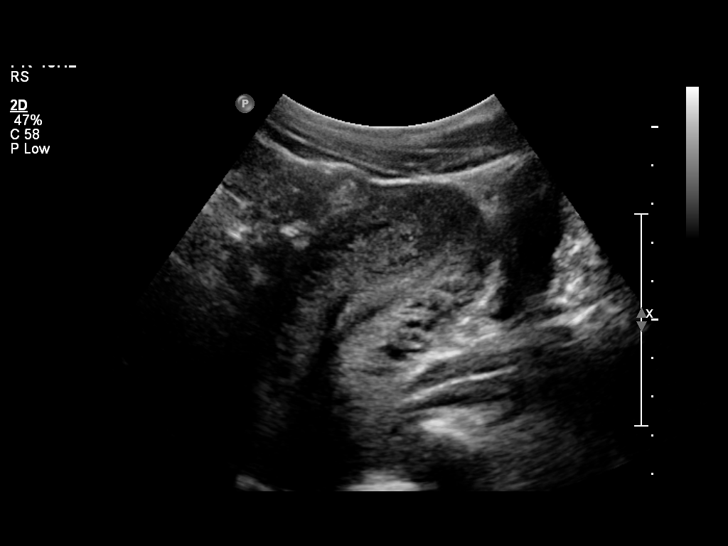
[im 5/38]
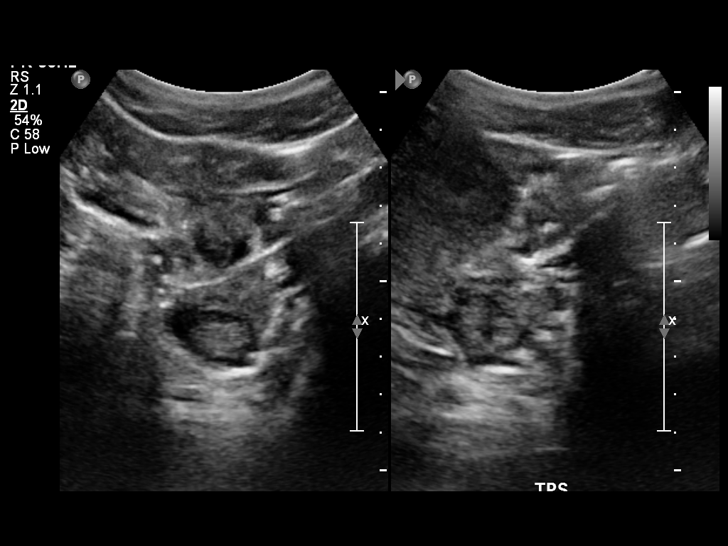
[im 7/38]
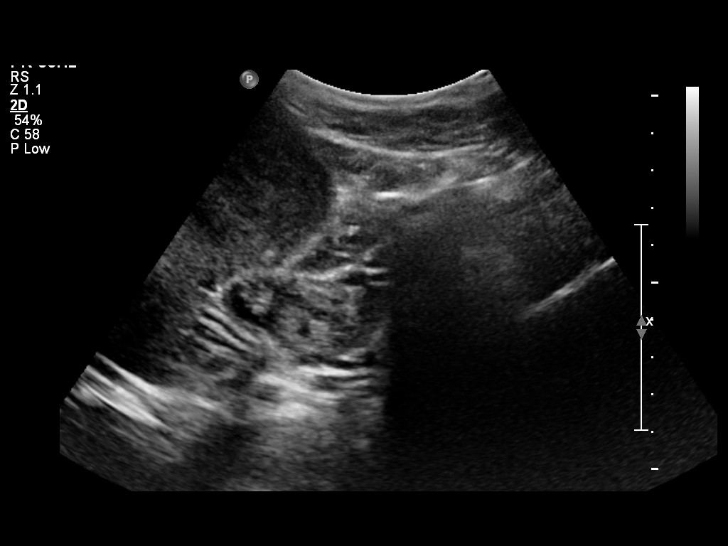
[im 10/38]
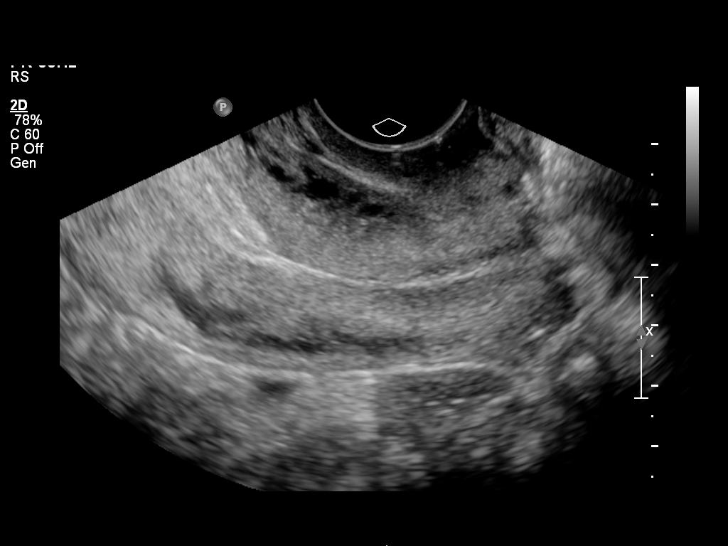
[im 13/38]
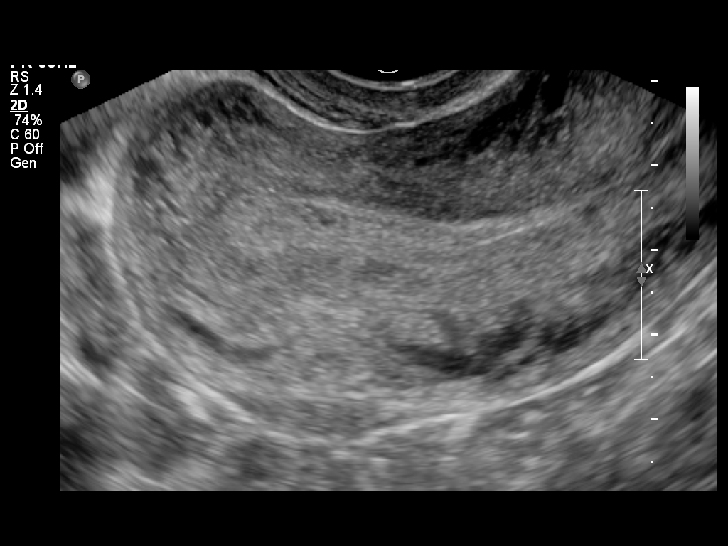
[im 16/38]
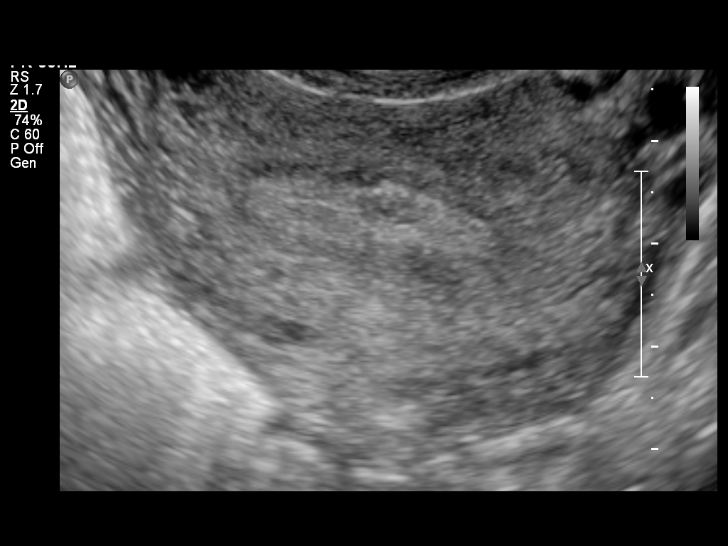
[im 20/38]
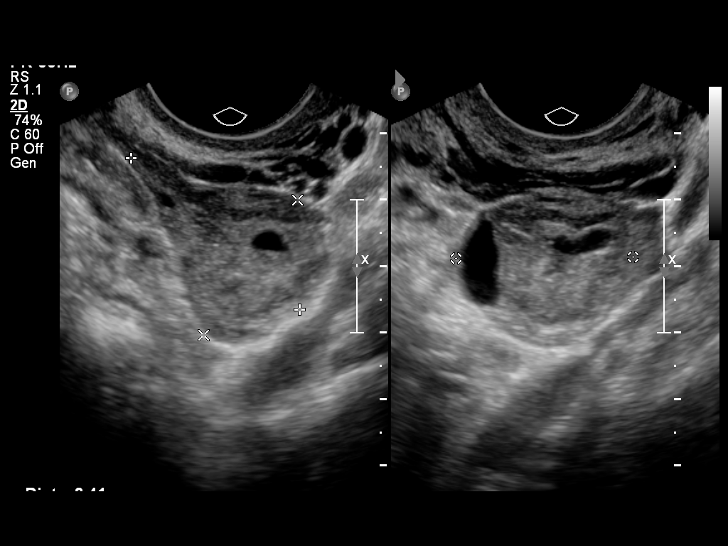
[im 22/38]
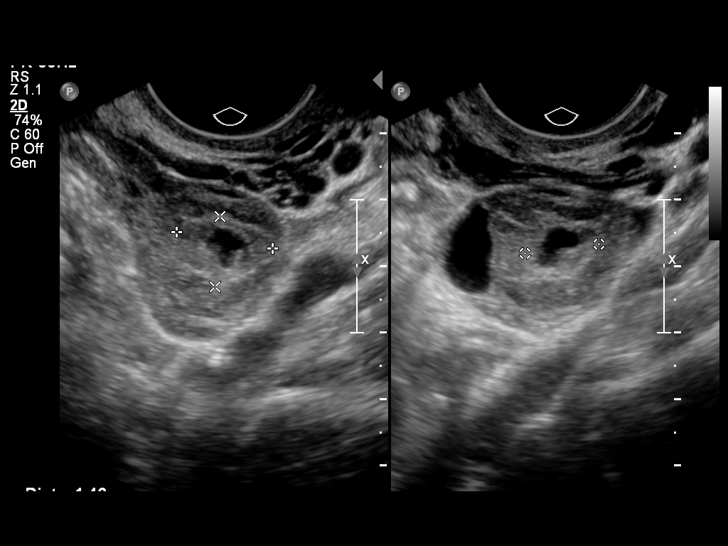
[im 25/38]
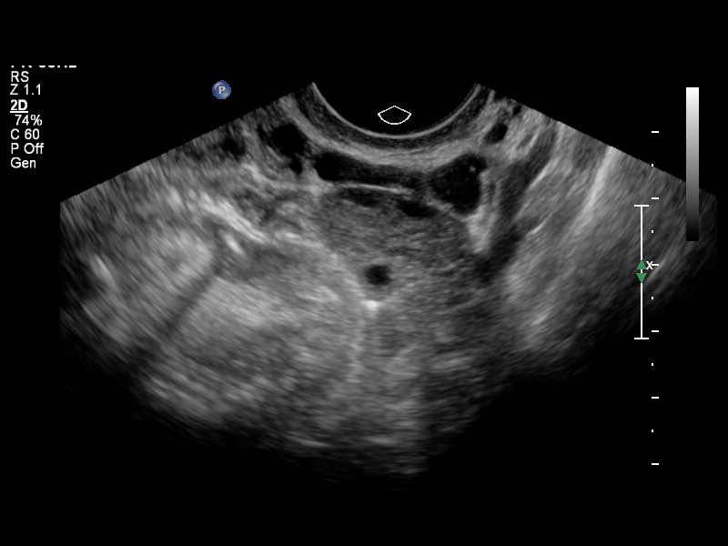
[im 28/38]
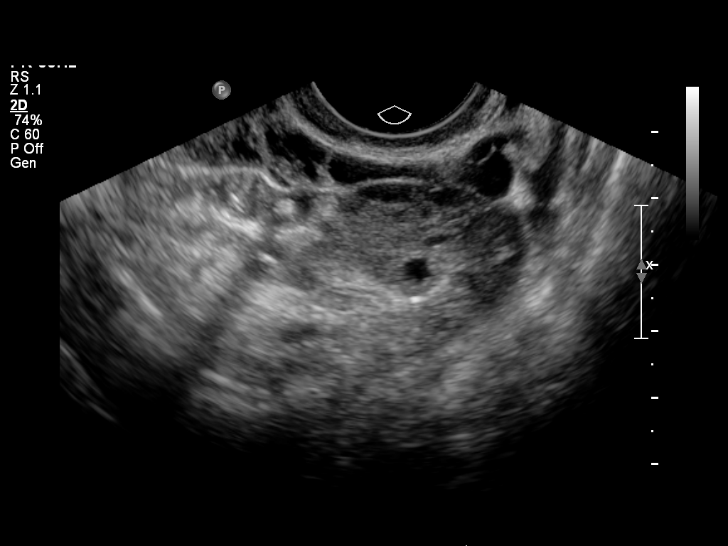
[im 31/38]
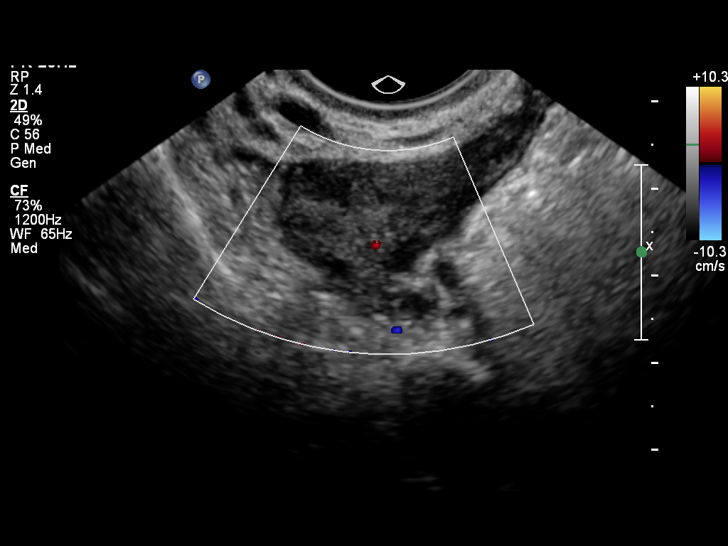
[im 33/38]
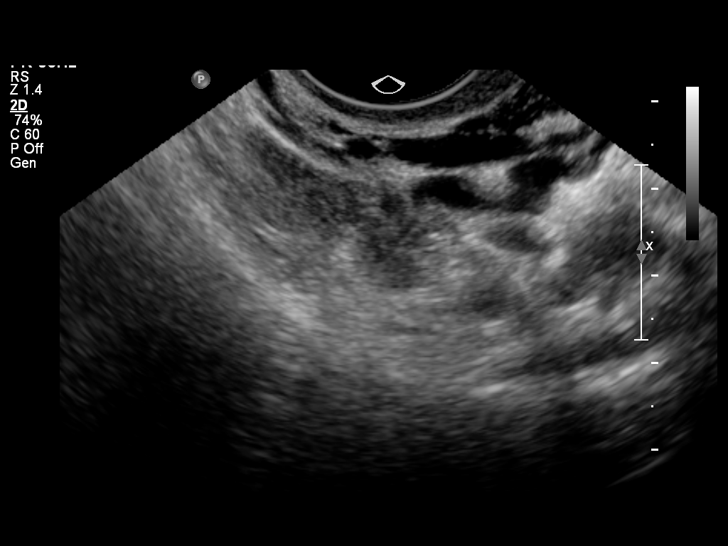
[im 36/38]
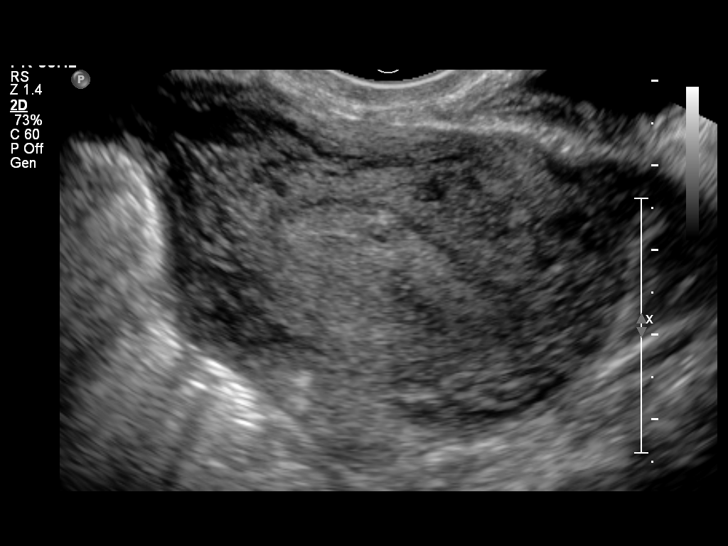

[13 of 28 positions shown; findings below may reference images not displayed]

Intrauterine gestational sac:  A tiny gestational sac is possible,
although equivocal.
Yolk sac: Not visualized.
Embryo: Not visualized.

MSD: 1.5 mm  4 w 4 d
US EDC: 03/22/2013

Maternal uterus/adnexae:
No subchronic hemorrhage.

Right ovary is within normal limits, measuring 2.4 x 1.7 x 2.0 cm.

Left ovary is within normal limits, measuring 3.4 x 2.5 x 2.7 cm,
and is notable for a 1.5 x 1.1 x 1.1 cm corpus luteal cyst.

No free fluid.
IMPRESSION: Possible early intrauterine gestational sac, equivocal.  If this
reflects a true finding, the estimated gestational age is 4 weeks 4
days by mean sac diameter.

While early normal IUP is favored, early abnormal IUP or
nonvisualized ectopic pregnancy remain theoretically possible.
Serial beta HCG with follow-up pelvic ultrasound in 14 days is
suggested.

## 2014-06-30 ENCOUNTER — Inpatient Hospital Stay (HOSPITAL_COMMUNITY): Payer: BC Managed Care – PPO

## 2014-06-30 ENCOUNTER — Inpatient Hospital Stay (HOSPITAL_COMMUNITY)
Admission: AD | Admit: 2014-06-30 | Discharge: 2014-06-30 | Disposition: A | Discharge status: Home / Self Care | Payer: BC Managed Care – PPO | Source: Ambulatory Visit | Attending: Obstetrics and Gynecology | Admitting: Obstetrics and Gynecology

## 2014-06-30 ENCOUNTER — Encounter (HOSPITAL_COMMUNITY): Payer: Self-pay | Admitting: *Deleted

## 2014-06-30 DIAGNOSIS — Z3A01 Less than 8 weeks gestation of pregnancy: Secondary | ICD-10-CM | POA: Insufficient documentation

## 2014-06-30 DIAGNOSIS — N8320 Unspecified ovarian cysts: Secondary | ICD-10-CM | POA: Insufficient documentation

## 2014-06-30 DIAGNOSIS — O3481 Maternal care for other abnormalities of pelvic organs, first trimester: Secondary | ICD-10-CM | POA: Diagnosis not present

## 2014-06-30 DIAGNOSIS — R1031 Right lower quadrant pain: Secondary | ICD-10-CM | POA: Diagnosis present

## 2014-06-30 DIAGNOSIS — N83202 Unspecified ovarian cyst, left side: Secondary | ICD-10-CM

## 2014-06-30 DIAGNOSIS — O468X1 Other antepartum hemorrhage, first trimester: Secondary | ICD-10-CM

## 2014-06-30 DIAGNOSIS — O99331 Smoking (tobacco) complicating pregnancy, first trimester: Secondary | ICD-10-CM | POA: Diagnosis not present

## 2014-06-30 DIAGNOSIS — Z3201 Encounter for pregnancy test, result positive: Secondary | ICD-10-CM

## 2014-06-30 DIAGNOSIS — O418X10 Other specified disorders of amniotic fluid and membranes, first trimester, not applicable or unspecified: Secondary | ICD-10-CM

## 2014-06-30 DIAGNOSIS — F17210 Nicotine dependence, cigarettes, uncomplicated: Secondary | ICD-10-CM | POA: Diagnosis not present

## 2014-06-30 HISTORY — DX: Unspecified asthma, uncomplicated: J45.909

## 2014-06-30 HISTORY — DX: Bipolar disorder, unspecified: F31.9

## 2014-06-30 HISTORY — DX: Panic disorder (episodic paroxysmal anxiety): F41.0

## 2014-06-30 LAB — URINALYSIS, ROUTINE W REFLEX MICROSCOPIC
Bilirubin Urine: NEGATIVE
Glucose, UA: NEGATIVE mg/dL
KETONES UR: NEGATIVE mg/dL
LEUKOCYTES UA: NEGATIVE
NITRITE: NEGATIVE
PH: 6 (ref 5.0–8.0)
Protein, ur: NEGATIVE mg/dL
Specific Gravity, Urine: <1.005 — ABNORMAL LOW (ref 1.005–1.030)
Urobilinogen, UA: 0.2 mg/dL (ref 0.0–1.0)

## 2014-06-30 LAB — CBC
HCT: 32.1 % — ABNORMAL LOW (ref 36.0–46.0)
Hemoglobin: 11.2 g/dL — ABNORMAL LOW (ref 12.0–15.0)
MCH: 28.7 pg (ref 26.0–34.0)
MCHC: 34.9 g/dL (ref 30.0–36.0)
MCV: 82.3 fL (ref 78.0–100.0)
Platelets: 129 10*3/uL — ABNORMAL LOW (ref 150–400)
RBC: 3.9 MIL/uL (ref 3.87–5.11)
RDW: 11.7 % (ref 11.5–15.5)
WBC: 4.9 10*3/uL (ref 4.0–10.5)

## 2014-06-30 LAB — WET PREP, GENITAL
Trich, Wet Prep: NONE SEEN
YEAST WET PREP: NONE SEEN

## 2014-06-30 LAB — URINE MICROSCOPIC-ADD ON

## 2014-06-30 LAB — HCG, QUANTITATIVE, PREGNANCY: HCG, BETA CHAIN, QUANT, S: 16589 m[IU]/mL — AB (ref <5)

## 2014-06-30 LAB — POCT PREGNANCY, URINE: Preg Test, Ur: POSITIVE — AB

## 2014-06-30 NOTE — MAU Provider Note (Signed)
History     CSN: [id number]  Arrival date and time: 06/30/14 3419 Provider notified of pt arrival @0815  Provider at bedside @0815    Chief Complaint  Patient presents with  . Abdominal Pain   HPI Comments: [id number] @[redacted]w[redacted]d  by LMP c/o RLQ pain x2 days. No fever. Nausea and vomiting only in am x2 wks. No VB or cramping. Hx of constipation but reports has improved 3 days ago.   Abdominal Pain Associated symptoms include nausea and vomiting. Pertinent negatives include no constipation or diarrhea.    OB History   Grav Para Term Preterm Abortions TAB SAB Ect Mult Living   4 1   2  1   1       Past Medical History  Diagnosis Date  . Asthma   . Panic attack   . Bipolar 1 disorder     Past Surgical History  Procedure Laterality Date  . No past surgeries      No family history on file.  History  Substance Use Topics  . Smoking status: Current Every Day Smoker    Types: Cigarettes  . Smokeless tobacco: Not on file  . Alcohol Use: No    Allergies: No Known Allergies  No prescriptions prior to admission    Review of Systems  Constitutional: Negative.   HENT: Negative.   Eyes: Negative.   Respiratory: Negative.   Cardiovascular: Negative.   Gastrointestinal: Positive for nausea, vomiting and abdominal pain. Negative for heartburn, diarrhea and constipation.  Genitourinary: Negative.   Musculoskeletal: Negative.   Skin: Negative.   Neurological: Negative.   Endo/Heme/Allergies: Negative.   Psychiatric/Behavioral: Negative.    Physical Exam   Blood pressure 104/69, pulse 71, temperature 98.9 F (37.2 C), temperature source Oral, resp. rate 18, height 5' 9.5" (1.765 m), weight 56.79 kg (125 lb 3.2 oz), last menstrual period 05/18/2014, unknown if currently breastfeeding.  Physical Exam  Constitutional: She is oriented to person, place, and time. She appears well-developed and well-nourished.  HENT:  Head: Normocephalic.  Neck: Normal range of motion.   Cardiovascular: Normal rate and regular rhythm.   Respiratory: Effort normal and breath sounds normal.  GI: Soft. Bowel sounds are normal. She exhibits no distension and no mass. There is tenderness. There is no rebound and no guarding.  RLQ  Genitourinary: Vagina normal.  Speculum: thick white discharge Wet prep: few clue, few WBC GC/CMT cultures obtained Bimanual: ? 2 cm right adnexal mass, cervix closed  Musculoskeletal: Normal range of motion.  Neurological: She is alert and oriented to person, place, and time.  Skin: Skin is warm and dry.  Psychiatric: She has a normal mood and affect.   Results for Shannon Duncan, Shannon Duncan (MRN [id number]) as of 06/30/2014 15:32  Ref. Range 06/30/2014 08:00  Color, Urine Latest Range: YELLOW  YELLOW  APPearance Latest Range: CLEAR  CLEAR  Specific Gravity, Urine Latest Range: 1.005-1.030  <1.005 (L)  pH Latest Range: 5.0-8.0  6.0  Glucose Latest Range: NEGATIVE mg/dL NEGATIVE  Bilirubin Urine Latest Range: NEGATIVE  NEGATIVE  Ketones, ur Latest Range: NEGATIVE mg/dL NEGATIVE  Protein Latest Range: NEGATIVE mg/dL NEGATIVE  Urobilinogen, UA Latest Range: 0.0-1.0 mg/dL 0.2  Nitrite Latest Range: NEGATIVE  NEGATIVE  Leukocytes, UA Latest Range: NEGATIVE  NEGATIVE  Hgb urine dipstick Latest Range: NEGATIVE  TRACE (A)  RBC / HPF Latest Range: <3 RBC/hpf 0-2  Squamous Epithelial / LPF Latest Range: RARE  FEW (A)  Bacteria, UA Latest Range: RARE  RARE  Results for Shannon Duncan, Shannon Duncan  Shannon Duncan (MRN [id number]) as of 06/30/2014 15:32  Ref. Range 06/30/2014 08:40  hCG, Beta Chain, Quant, S Latest Range: <5 mIU/mL 16589 (H)  WBC Latest Range: 4.0-10.5 K/uL 4.9  RBC Latest Range: 3.87-5.11 MIL/uL 3.90  Hemoglobin Latest Range: 12.0-15.0 g/dL 11.2 (L)  HCT Latest Range: 36.0-46.0 % 32.1 (L)  MCV Latest Range: 78.0-100.0 fL 82.3  MCH Latest Range: 26.0-34.0 pg 28.7  MCHC Latest Range: 30.0-36.0 g/dL 34.9  RDW Latest Range: 11.5-15.5 % 11.7  Platelets Latest Range: 150-400  K/uL 129 (L)   SONO: CLINICAL DATA: Right upper quadrant pain. Positive pregnancy test.  EXAM:  OBSTETRIC <14 WK [place] AND TRANSVAGINAL OB US  TECHNIQUE:  Both transabdominal and transvaginal ultrasound examinations were  performed for complete evaluation of the gestation as well as the  maternal uterus, adnexal regions, and pelvic cul-de-sac.  Transvaginal technique was performed to assess early pregnancy.  COMPARISON: None.  FINDINGS:  Intrauterine gestational sac: Present and normal in appearance.  Yolk sac: Present.  Embryo: Present.  Cardiac Activity: Present.  Heart Rate: 116 Bpm  CRL: 0.3 cm 6 w 0 d [place] EDC: 02/23/2015.  Maternal uterus/adnexae: Tiny subchorionic hemorrhage. 1.9 cm left  ovarian cyst, most likely physiologic.  IMPRESSION:  1. Single viable intrauterine pregnancy is 6 weeks 0 days. Fetal  heart rate 116 beats per min.  2. Tiny subchorionic hemorrhage.  Electronically Signed  By: Marcello Moores Register  On: 06/30/2014 09:46  MAU Course  Procedures  CBC, UA, Sono GC/CMT cultures pending  Assessment and Plan  [redacted]w[redacted]d gestation Pregnancy confirmation RLQ pain-likely physiologic Left ovarian cyst [facility]  Reassurance normal pregnancy Discharge home Tylenol 1000 mg po q6 hrs prn Heating pad to affected area Warm baths prn Start PNV with folic acid Follow-up in 1 week in office w/Dr. Garwin Brothers  Dr. Ronita Hipps consulted with A/P, agrees.   Camella Seim, [place], N 06/30/2014, 3:33 PM

## 2014-06-30 NOTE — Discharge Instructions (Signed)
Abdominal Pain During Pregnancy Abdominal pain is common in pregnancy. Most of the time, it does not cause harm. There are many causes of abdominal pain. Some causes are more serious than others. Some of the causes of abdominal pain in pregnancy are easily diagnosed. Occasionally, the diagnosis takes time to understand. Other times, the cause is not determined. Abdominal pain can be a sign that something is very wrong with the pregnancy, or the pain may have nothing to do with the pregnancy at all. For this reason, always tell your health care provider if you have any abdominal discomfort. HOME CARE INSTRUCTIONS  Monitor your abdominal pain for any changes. The following actions may help to alleviate any discomfort you are experiencing:  Do not have sexual intercourse or put anything in your vagina until your symptoms go away completely.  Get plenty of rest until your pain improves.  Drink clear fluids if you feel nauseous. Avoid solid food as long as you are uncomfortable or nauseous.  Only take over-the-counter or prescription medicine as directed by your health care provider.  Keep all follow-up appointments with your health care provider. SEEK IMMEDIATE MEDICAL CARE IF:  You are bleeding, leaking fluid, or passing tissue from the vagina.  You have increasing pain or cramping.  You have persistent vomiting.  You have painful or bloody urination.  You have a fever.  You notice a decrease in your baby's movements.  You have extreme weakness or feel faint.  You have shortness of breath, with or without abdominal pain.  You develop a severe headache with abdominal pain.  You have abnormal vaginal discharge with abdominal pain.  You have persistent diarrhea.  You have abdominal pain that continues even after rest, or gets worse. MAKE SURE YOU:   Understand these instructions.  Will watch your condition.  Will get help right away if you are not doing well or get  worse. Document Released: 09/16/2005 Document Revised: 07/07/2013 Document Reviewed: 04/15/2013 [facility] Patient Information 2015 [place], [place]. This information is not intended to replace advice given to you by your health care provider. Make sure you discuss any questions you have with your health care provider.  Start prenatal vitamin today-may buy over the counter  Constipation Constipation is when a person has fewer than three bowel movements a week, has difficulty having a bowel movement, or has stools that are dry, hard, or larger than normal. As people grow older, constipation is more common. If you try to fix constipation with medicines that make you have a bowel movement (laxatives), the problem may get worse. Long-term laxative use may cause the muscles of the colon to become weak. A low-fiber diet, not taking in enough fluids, and taking certain medicines may make constipation worse.  CAUSES   Certain medicines, such as antidepressants, pain medicine, iron supplements, antacids, and water pills.   Certain diseases, such as diabetes, irritable bowel syndrome (IBS), thyroid disease, or depression.   Not drinking enough water.   Not eating enough fiber-rich foods.   Stress or travel.   Lack of physical activity or exercise.   Ignoring the urge to have a bowel movement.   Using laxatives too much.  SIGNS AND SYMPTOMS   Having fewer than three bowel movements a week.   Straining to have a bowel movement.   Having stools that are hard, dry, or larger than normal.   Feeling full or bloated.   Pain in the lower abdomen.   Not feeling relief after having a  bowel movement.  DIAGNOSIS  Your health care provider will take a medical history and perform a physical exam. Further testing may be done for severe constipation. Some tests may include:  A barium enema X-ray to examine your rectum, colon, and, sometimes, your small intestine.   A sigmoidoscopy to  examine your lower colon.   A colonoscopy to examine your entire colon. TREATMENT  Treatment will depend on the severity of your constipation and what is causing it. Some dietary treatments include drinking more fluids and eating more fiber-rich foods. Lifestyle treatments may include regular exercise. If these diet and lifestyle recommendations do not help, your health care provider may recommend taking over-the-counter laxative medicines to help you have bowel movements. Prescription medicines may be prescribed if over-the-counter medicines do not work.  HOME CARE INSTRUCTIONS   Eat foods that have a lot of fiber, such as fruits, vegetables, whole grains, and beans.  Limit foods high in fat and processed sugars, such as french fries, hamburgers, cookies, candies, and soda.   A fiber supplement may be added to your diet if you cannot get enough fiber from foods.   Drink enough fluids to keep your urine clear or pale yellow.   Exercise regularly or as directed by your health care provider.   Go to the restroom when you have the urge to go. Do not hold it.   Only take over-the-counter or prescription medicines as directed by your health care provider. Do not take other medicines for constipation without talking to your health care provider first.  [place] IF:   You have bright red blood in your stool.   Your constipation lasts for more than 4 days or gets worse.   You have abdominal or rectal pain.   You have thin, pencil-like stools.   You have unexplained weight loss. MAKE SURE YOU:   Understand these instructions.  Will watch your condition.  Will get help right away if you are not doing well or get worse. Document Released: 06/14/2004 Document Revised: 09/21/2013 Document Reviewed: 06/28/2013 [facility] Patient Information 2015 [place], [place]. This information is not intended to replace advice given to you by your health care provider. Make sure  you discuss any questions you have with your health care provider. May take magnesium oxide 200 mg by mouth daily

## 2014-06-30 NOTE — MAU Note (Signed)
C/o URQ pain that started 2 days ago. Worse when she coughs. Took HPT 2 weeks ago and it was positive.

## 2014-07-01 LAB — GC/CHLAMYDIA PROBE AMP
CT PROBE, AMP APTIMA: NEGATIVE
GC Probe RNA: NEGATIVE

## 2014-08-01 ENCOUNTER — Encounter (HOSPITAL_COMMUNITY): Payer: Self-pay | Admitting: *Deleted

## 2014-08-18 ENCOUNTER — Other Ambulatory Visit (HOSPITAL_COMMUNITY): Payer: Self-pay | Admitting: Obstetrics and Gynecology

## 2014-08-18 DIAGNOSIS — Z3A13 13 weeks gestation of pregnancy: Secondary | ICD-10-CM

## 2014-08-18 DIAGNOSIS — O358XX1 Maternal care for other (suspected) fetal abnormality and damage, fetus 1: Secondary | ICD-10-CM

## 2014-08-18 DIAGNOSIS — IMO0001 Reserved for inherently not codable concepts without codable children: Secondary | ICD-10-CM

## 2014-08-19 ENCOUNTER — Encounter (HOSPITAL_COMMUNITY): Payer: Self-pay

## 2014-08-19 ENCOUNTER — Ambulatory Visit (HOSPITAL_COMMUNITY)
Admission: RE | Admit: 2014-08-19 | Discharge: 2014-08-19 | Disposition: A | Discharge status: Home / Self Care | Payer: BC Managed Care – PPO | Source: Ambulatory Visit | Attending: Obstetrics and Gynecology | Admitting: Obstetrics and Gynecology

## 2014-08-19 ENCOUNTER — Other Ambulatory Visit (HOSPITAL_COMMUNITY): Payer: Self-pay | Admitting: Obstetrics and Gynecology

## 2014-08-19 DIAGNOSIS — Z315 Encounter for genetic counseling: Secondary | ICD-10-CM | POA: Insufficient documentation

## 2014-08-19 DIAGNOSIS — O358XX1 Maternal care for other (suspected) fetal abnormality and damage, fetus 1: Secondary | ICD-10-CM

## 2014-08-19 DIAGNOSIS — IMO0002 Reserved for concepts with insufficient information to code with codable children: Secondary | ICD-10-CM | POA: Insufficient documentation

## 2014-08-19 DIAGNOSIS — Z3A13 13 weeks gestation of pregnancy: Secondary | ICD-10-CM | POA: Diagnosis not present

## 2014-08-19 DIAGNOSIS — O358XX0 Maternal care for other (suspected) fetal abnormality and damage, not applicable or unspecified: Secondary | ICD-10-CM | POA: Insufficient documentation

## 2014-08-19 DIAGNOSIS — IMO0001 Reserved for inherently not codable concepts without codable children: Secondary | ICD-10-CM

## 2014-08-19 DIAGNOSIS — O3621X0 Maternal care for hydrops fetalis, first trimester, not applicable or unspecified: Secondary | ICD-10-CM | POA: Insufficient documentation

## 2014-08-19 NOTE — ED Notes (Signed)
Pt having mild cramping and spotting.

## 2014-08-22 NOTE — Progress Notes (Signed)
Genetic Counseling  High-Risk Gestation Note  Appointment Date:  08/19/2014 Referred By: Marvene Staff, * Date of Birth:  Apr 29, 1981   Pregnancy History: [id number] Estimated Date of Delivery: 02/22/15 Estimated Gestational Age: 30w2dAttending: MRenella Cunas MD   I met with Ms. Shannon SHACKETTfor genetic counseling because of abnormal ultrasound findings. She was accompanied by her mother to today's visit. [employer] Genetic Counseling Intern, VSantiago Bumpers assisted with genetic counseling under my direct supervision.   We began by reviewing the ultrasound in detail. Ultrasound previously performed in her OB office on 08/18/14 visualized cystic hygroma. OB medical records indicated that noninvasive prenatal screening (NIPS)/cell free DNA was drawn (InformaSeq to be performed through [facility]) in her OB office at that time. 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 Additionally, the following were visualized on today's ultrasound: single umbilical artery, abnormal head shape, echogenic bowel, and possible abnormal finger positioning. Complete ultrasound results reported separately.    We discussed the ultrasound findings. A cystic hygroma describes a septated fluid filled sac at the back of the neck that typically results from failure of the fetal lymphatic system.  We discussed the various etiologies for a hygroma including normal variation (immature lymphatic system), a chromosome condition, single gene condition, or a congenital anomaly (heart defect). We discussed that the fetal prognosis is largely dependent upon the underlying cause of the hygroma, but that there are indications by ultrasound, such as hydrops, which significantly increase the likelihood of a poor prognosis, regardless of the underlying etiology.  We discussed that the findings of cystic hygroma with hydrops is associated with a  very poor prognosis and typically results in pregnancy loss.     We discussed that the ultrasound findings are suggestive of an underlying chromosome conditions, specifically trisomy 137 Ms MSchlosserwas very upset during our discussion today and declined detailed discussion of chromosomes and examples of chromosome conditions.  Ms. MHarshmanpreviously had noninvasive prenatal screening (NIPS)/cell free DNA (cfDNA) testing through her obstetrician's office (InformaSeq). These results are currently pending.  We reviewed that while this testing has a high sensitivity and specificity, it is not considered to be diagnostic.  For trisomy 21, the detection rate is 99%; for trisomy 18, the detection rate is 97%, and for trisomy 13, the detection rate is 87.5%.  Cystic hygromas can also be caused by a variety of other chromosome aberrations including: microdeletions, microduplications, and translocations.  We briefly discussed the availability of diagnostic testing (chromosome analysis) via [facility] and amniocentesis.  She was counseled regarding the associated risks for complications for each.  Ms. MHeraldwas not interested in additional diagnostic testing during the pregnancy. Additionally, we discussed that karyotype and/or microarray chromosome analysis could be performed postnatally or on products of conception.     We also discussed that a cystic hygroma can be a feature of many underlying single gene conditions, such as Noonan syndrome, SLOS, and skeletal dysplasias.  Prenatal diagnosis is available for some single gene conditions, but that in most cases targeted analysis is recommended by a medical geneticist following a post-natal physical exam and review of medical records.  If however, ultrasound findings or a positive family history strongly suggest an increased risk for a specific condition, testing may be performed prenatally.  Congenital heart defects (CHD) are commonly associated with cystic hygromas.  The  prevalence of a CHD is in the order of 6 times higher in fetuses with a  cystic hygroma/increased nuchal translucency as compared to an unselected population.  There does not appear to be an association between any specific CHD and cystic hygromas.  The chance for a CHD increases exponentially with increasing NT measurements.    We discussed the recurrence risk depends upon the specific underlying etiology, which has not been determined at this time. We discussed that while many of the causes of a cystic hygroma can be investigated during pregnancy, not all conditions and causes can be determined prenatally.  We discussed the option of continuing the pregnancy versus termination of pregnancy. Regarding termination of pregnancy, we discussed induction of labor versus D&C. Shannon Duncan indicated that she would like to proceed with termination of pregnancy via D&C and would prefer for this to be facilitated through her OB office. We communicated with the patient's OB office while Ms. Funaro remained in our office today. They planned to contact the patient early next week and facilitate TOP for the patient.    Shannon Duncan was understandably very upset during today's visit. She appears to have good support from her mother, who was present with her. We discussed the additional resource of spiritual care support through [place] declined meeting with them today but is aware that this is available in the future. We encouraged the patient to contact us should additional resources or support be beneficial to her.   Shannon Duncan declined a detailed family history discussion given the nature of today's visit. No known birth defects or genetic conditions were reported. Without further information regarding the provided family history, an accurate genetic risk cannot be calculated. Further genetic counseling is warranted if more information is obtained.  Her medical and surgical histories were not  discussed at the time of today's visit given the nature of today's session.   I counseled Shannon Duncan regarding the above risks and available options.  The approximate face-to-face time with the genetic counselor was 35 minutes.  Chipper Oman, MS Certified Genetic Counselor 08/22/2014

## 2014-08-23 ENCOUNTER — Other Ambulatory Visit (HOSPITAL_COMMUNITY): Payer: Self-pay

## 2014-08-23 ENCOUNTER — Other Ambulatory Visit: Payer: Self-pay | Admitting: Obstetrics and Gynecology

## 2014-08-23 DIAGNOSIS — Z419 Encounter for procedure for purposes other than remedying health state, unspecified: Secondary | ICD-10-CM

## 2014-08-23 MED ORDER — DOXYCYCLINE HYCLATE 100 MG IV SOLR
100.0000 mg | INTRAVENOUS | Status: AC
  Administered 2014-08-24: 100 mg via INTRAVENOUS
  Filled 2014-08-23: qty 100

## 2014-08-24 ENCOUNTER — Ambulatory Visit (HOSPITAL_COMMUNITY)
Admission: RE | Admit: 2014-08-24 | Discharge: 2014-08-24 | Disposition: A | Discharge status: Home / Self Care | Payer: BC Managed Care – PPO | Source: Ambulatory Visit | Attending: Obstetrics and Gynecology | Admitting: Obstetrics and Gynecology

## 2014-08-24 ENCOUNTER — Ambulatory Visit (HOSPITAL_COMMUNITY): Payer: BC Managed Care – PPO | Admitting: Anesthesiology

## 2014-08-24 ENCOUNTER — Encounter (HOSPITAL_COMMUNITY)
Admission: RE | Disposition: A | Discharge status: Home / Self Care | Payer: Self-pay | Source: Ambulatory Visit | Attending: Obstetrics and Gynecology

## 2014-08-24 ENCOUNTER — Ambulatory Visit (HOSPITAL_COMMUNITY): Payer: BC Managed Care – PPO

## 2014-08-24 ENCOUNTER — Encounter (HOSPITAL_COMMUNITY): Payer: Self-pay | Admitting: Anesthesiology

## 2014-08-24 DIAGNOSIS — O337 Maternal care for disproportion due to other fetal deformities: Secondary | ICD-10-CM | POA: Insufficient documentation

## 2014-08-24 DIAGNOSIS — F319 Bipolar disorder, unspecified: Secondary | ICD-10-CM | POA: Insufficient documentation

## 2014-08-24 DIAGNOSIS — D181 Lymphangioma, any site: Secondary | ICD-10-CM | POA: Diagnosis present

## 2014-08-24 DIAGNOSIS — F17210 Nicotine dependence, cigarettes, uncomplicated: Secondary | ICD-10-CM | POA: Insufficient documentation

## 2014-08-24 DIAGNOSIS — F329 Major depressive disorder, single episode, unspecified: Secondary | ICD-10-CM | POA: Insufficient documentation

## 2014-08-24 DIAGNOSIS — Z3A13 13 weeks gestation of pregnancy: Secondary | ICD-10-CM

## 2014-08-24 DIAGNOSIS — IMO0001 Reserved for inherently not codable concepts without codable children: Secondary | ICD-10-CM

## 2014-08-24 DIAGNOSIS — O358XX1 Maternal care for other (suspected) fetal abnormality and damage, fetus 1: Secondary | ICD-10-CM

## 2014-08-24 DIAGNOSIS — O359XX1 Maternal care for (suspected) fetal abnormality and damage, unspecified, fetus 1: Secondary | ICD-10-CM

## 2014-08-24 DIAGNOSIS — O359XX0 Maternal care for (suspected) fetal abnormality and damage, unspecified, not applicable or unspecified: Secondary | ICD-10-CM

## 2014-08-24 HISTORY — PX: OPERATIVE ULTRASOUND: SHX5996

## 2014-08-24 HISTORY — PX: DILATION AND EVACUATION: SHX1459

## 2014-08-24 LAB — CBC
HCT: 34.3 % — ABNORMAL LOW (ref 36.0–46.0)
HEMOGLOBIN: 12 g/dL (ref 12.0–15.0)
MCH: 28.9 pg (ref 26.0–34.0)
MCHC: 35 g/dL (ref 30.0–36.0)
MCV: 82.7 fL (ref 78.0–100.0)
PLATELETS: 168 10*3/uL (ref 150–400)
RBC: 4.15 MIL/uL (ref 3.87–5.11)
RDW: 12 % (ref 11.5–15.5)
WBC: 9 10*3/uL (ref 4.0–10.5)

## 2014-08-24 SURGERY — DILATION AND EVACUATION, UTERUS, SECOND TRIMESTER
Anesthesia: General | Site: Vagina

## 2014-08-24 MED ORDER — OXYCODONE-ACETAMINOPHEN 5-325 MG PO TABS: 1.0000 | ORAL_TABLET | Freq: Four times a day (QID) | ORAL | Status: DC | PRN

## 2014-08-24 MED ORDER — DEXAMETHASONE SODIUM PHOSPHATE 4 MG/ML IJ SOLN
INTRAMUSCULAR | Status: AC
  Filled 2014-08-24: qty 1

## 2014-08-24 MED ORDER — LACTATED RINGERS IV SOLN
INTRAVENOUS | Status: DC
  Administered 2014-08-24 (×3): via INTRAVENOUS

## 2014-08-24 MED ORDER — IBUPROFEN 800 MG PO TABS: 800.0000 mg | ORAL_TABLET | Freq: Three times a day (TID) | ORAL | Status: DC | PRN

## 2014-08-24 MED ORDER — KETOROLAC TROMETHAMINE 30 MG/ML IJ SOLN
INTRAMUSCULAR | Status: AC
  Filled 2014-08-24: qty 2

## 2014-08-24 MED ORDER — CHLOROPROCAINE HCL 1 % IJ SOLN
INTRAMUSCULAR | Status: AC
  Filled 2014-08-24: qty 30

## 2014-08-24 MED ORDER — MIDAZOLAM HCL 2 MG/2ML IJ SOLN
INTRAMUSCULAR | Status: AC
  Filled 2014-08-24: qty 2

## 2014-08-24 MED ORDER — FENTANYL CITRATE 0.05 MG/ML IJ SOLN
INTRAMUSCULAR | Status: AC
  Filled 2014-08-24: qty 5

## 2014-08-24 MED ORDER — LIDOCAINE HCL (CARDIAC) 20 MG/ML IV SOLN
INTRAVENOUS | Status: AC
Start: 2014-08-24 — End: 2014-08-24
  Filled 2014-08-24: qty 5

## 2014-08-24 MED ORDER — MIDAZOLAM HCL 2 MG/2ML IJ SOLN
INTRAMUSCULAR | Status: DC | PRN
  Administered 2014-08-24: 2 mg via INTRAVENOUS

## 2014-08-24 MED ORDER — DEXAMETHASONE SODIUM PHOSPHATE 10 MG/ML IJ SOLN
INTRAMUSCULAR | Status: DC | PRN
  Administered 2014-08-24: 4 mg via INTRAVENOUS

## 2014-08-24 MED ORDER — ONDANSETRON HCL 4 MG/2ML IJ SOLN
INTRAMUSCULAR | Status: DC | PRN
  Administered 2014-08-24: 4 mg via INTRAVENOUS

## 2014-08-24 MED ORDER — KETOROLAC TROMETHAMINE 30 MG/ML IJ SOLN
INTRAMUSCULAR | Status: DC | PRN
  Administered 2014-08-24 (×2): 30 mg via INTRAVENOUS

## 2014-08-24 MED ORDER — PROPOFOL 10 MG/ML IV BOLUS
INTRAVENOUS | Status: DC | PRN
  Administered 2014-08-24: 150 mg via INTRAVENOUS

## 2014-08-24 MED ORDER — METHYLERGONOVINE MALEATE 0.2 MG/ML IJ SOLN
INTRAMUSCULAR | Status: DC | PRN
  Administered 2014-08-24: 0.2 mg via INTRAMUSCULAR

## 2014-08-24 MED ORDER — CHLOROPROCAINE HCL 1 % IJ SOLN
INTRAMUSCULAR | Status: DC | PRN
  Administered 2014-08-24: 10 mL

## 2014-08-24 MED ORDER — ONDANSETRON HCL 4 MG/2ML IJ SOLN
INTRAMUSCULAR | Status: AC
  Filled 2014-08-24: qty 2

## 2014-08-24 MED ORDER — SCOPOLAMINE 1 MG/3DAYS TD PT72: 1.0000 | MEDICATED_PATCH | Freq: Once | TRANSDERMAL | Status: DC

## 2014-08-24 MED ORDER — FENTANYL CITRATE 0.05 MG/ML IJ SOLN
INTRAMUSCULAR | Status: DC | PRN
  Administered 2014-08-24 (×4): 50 ug via INTRAVENOUS

## 2014-08-24 MED ORDER — PROPOFOL 10 MG/ML IV EMUL
INTRAVENOUS | Status: AC
  Filled 2014-08-24: qty 20

## 2014-08-24 MED ORDER — METHYLERGONOVINE MALEATE 0.2 MG/ML IJ SOLN
INTRAMUSCULAR | Status: AC
  Filled 2014-08-24: qty 1

## 2014-08-24 SURGICAL SUPPLY — 20 items
CATH ROBINSON RED A/P 16FR (CATHETERS) ×2 IMPLANT
CLOTH BEACON ORANGE TIMEOUT ST (SAFETY) ×2 IMPLANT
DECANTER SPIKE VIAL GLASS SM (MISCELLANEOUS) ×2 IMPLANT
GLOVE BIOGEL PI IND STRL 7.0 (GLOVE) ×1 IMPLANT
GLOVE BIOGEL PI INDICATOR 7.0 (GLOVE) ×1
GLOVE ECLIPSE 6.5 STRL STRAW (GLOVE) ×2 IMPLANT
GOWN STRL REUS W/TWL LRG LVL3 (GOWN DISPOSABLE) ×4 IMPLANT
KIT BERKELEY 2ND TRIMESTER 1/2 (COLLECTOR) ×2 IMPLANT
NS IRRIG 1000ML POUR BTL (IV SOLUTION) ×2 IMPLANT
PACK VAGINAL MINOR WOMEN LF (CUSTOM PROCEDURE TRAY) ×2 IMPLANT
PAD OB MATERNITY 4.3X12.25 (PERSONAL CARE ITEMS) ×2 IMPLANT
PAD PREP 24X48 CUFFED NSTRL (MISCELLANEOUS) ×2 IMPLANT
SET BERKELEY SUCTION TUBING (SUCTIONS) ×2 IMPLANT
TOWEL OR 17X24 6PK STRL BLUE (TOWEL DISPOSABLE) ×4 IMPLANT
TUBE VACURETTE 2ND TRIMESTER (CANNULA) ×2 IMPLANT
VACURETTE 10 RIGID CVD (CANNULA) ×2 IMPLANT
VACURETTE 12 RIGID CVD (CANNULA) IMPLANT
VACURETTE 14MM CVD 1/2 BASE (CANNULA) ×2 IMPLANT
VACURETTE 16MM ASPIR CVD .5 (CANNULA) IMPLANT
VACURETTE 9 RIGID CVD (CANNULA) IMPLANT

## 2014-08-24 NOTE — Transfer of Care (Signed)
Immediate Anesthesia Transfer of Care Note  Patient: Shannon Duncan  Procedure(s) Performed: Procedure(s) with comments: DILATATION AND EVACUATION (D&E) 2ND TRIMESTER (N/A) - 1 hr. OPERATIVE ULTRASOUND (N/A)  Patient Location: PACU  Anesthesia Type:General  Level of Consciousness: awake, alert  and sedated  Airway & Oxygen Therapy: Patient Spontanous Breathing and Patient connected to nasal cannula oxygen  Post-op Assessment: Report given to PACU RN and Post -op Vital signs reviewed and stable  Post vital signs: Reviewed and stable  Complications: No apparent anesthesia complications

## 2014-08-24 NOTE — Transfer of Care (Deleted)
Immediate Anesthesia Transfer of Care Note  Patient: Shannon Duncan  Procedure(s) Performed: Procedure(s) with comments: DILATATION AND EVACUATION (D&E) 2ND TRIMESTER (N/A) - 1 hr. OPERATIVE ULTRASOUND (N/A)  Patient Location: PACU  Anesthesia Type:General  Level of Consciousness: awake, alert  and oriented  Airway & Oxygen Therapy: Patient Spontanous Breathing and Patient connected to nasal cannula oxygen  Post-op Assessment: Report given to PACU RN and Post -op Vital signs reviewed and stable  Post vital signs: Reviewed and stable  Complications: No apparent anesthesia complications

## 2014-08-24 NOTE — Brief Op Note (Signed)
08/24/2014  4:19 PM  PATIENT:  Laurann [place]  33 y.o. female  PRE-OPERATIVE DIAGNOSIS:  Fetal Anomaly, 13 1/[redacted] wk gestation, cystic hygroma  POST-OPERATIVE DIAGNOSIS:  Fetal Anomaly, 13 1/2 wks, cystic hygroma  PROCEDURE:  Procedure(s) with comments: DILATATION AND EVACUATION (D&E) 2ND TRIMESTER (N/A) - 1 hr. OPERATIVE ULTRASOUND (N/A)  SURGEON:  Surgeon(s) and Role:    * Claressa Hughley Clint Bolder, MD - Primary  PHYSICIAN ASSISTANT:   ASSISTANTS: none   ANESTHESIA:   general and paracervical block  EBL:  Total I/O In: 1000 [I.V.:1000] Out: 75 [Urine:75]  BLOOD ADMINISTERED:none  DRAINS: none   LOCAL MEDICATIONS USED:  OTHER nesicaine  SPECIMEN:  Source of Specimen:  POC, tissue for chromosomal analysis  DISPOSITION OF SPECIMEN:  PATHOLOGY  COUNTS:  YES  TOURNIQUET:  * No tourniquets in log *  DICTATION: .Other Dictation: Dictation Number [id number]  PLAN OF CARE: Discharge to home after PACU  PATIENT DISPOSITION:  PACU - hemodynamically stable.   Delay start of Pharmacological VTE agent (>24hrs) due to surgical blood loss or risk of bleeding: no

## 2014-08-24 NOTE — Anesthesia Preprocedure Evaluation (Signed)
Anesthesia Evaluation  Patient identified by MRN, date of birth, ID band Patient awake    Reviewed: Allergy & Precautions, H&P , NPO status , Patient's Chart, lab work & pertinent test results  Airway Mallampati: I  TM Distance: >3 FB Neck ROM: full    Dental no notable dental hx.    Pulmonary Current Smoker,    Pulmonary exam normal       Cardiovascular negative cardio ROS      Neuro/Psych PSYCHIATRIC DISORDERS Anxiety Bipolar Disorder negative neurological ROS     GI/Hepatic negative GI ROS, Neg liver ROS,   Endo/Other  negative endocrine ROS  Renal/GU negative Renal ROS     Musculoskeletal   Abdominal Normal abdominal exam  (+)   Peds  Hematology negative hematology ROS (+)   Anesthesia Other Findings   Reproductive/Obstetrics (+) Pregnancy                             Anesthesia Physical Anesthesia Plan  ASA: II  Anesthesia Plan: General   Post-op Pain Management:    Induction: Intravenous  Airway Management Planned: LMA  Additional Equipment:   Intra-op Plan:   Post-operative Plan:   Informed Consent: I have reviewed the patients History and Physical, chart, labs and discussed the procedure including the risks, benefits and alternatives for the proposed anesthesia with the patient or authorized representative who has indicated his/her understanding and acceptance.     Plan Discussed with: CRNA and Surgeon  Anesthesia Plan Comments:         Anesthesia Quick Evaluation

## 2014-08-24 NOTE — Anesthesia Procedure Notes (Signed)
Procedure Name: LMA Insertion Date/Time: 08/24/2014 3:21 PM Performed by: Kemiah Booz, Sheron Nightingale Pre-anesthesia Checklist: Patient identified, Patient being monitored, Emergency Drugs available, Timeout performed and Suction available Patient Re-evaluated:Patient Re-evaluated prior to inductionOxygen Delivery Method: Circle system utilized Preoxygenation: Pre-oxygenation with 100% oxygen Intubation Type: IV induction LMA: LMA inserted LMA Size: 4.0 Tube size: 4.5 mm Number of attempts: 1 Placement Confirmation: positive ETCO2 ETT to lip (cm): at lips. Dental Injury: Teeth and Oropharynx as per pre-operative assessment

## 2014-08-24 NOTE — Discharge Instructions (Signed)
Excuse from Work Mahina Salatino needs to be excused from:  ___X__ Work _____ [employer] _____ Physical activity  Beginning now and through the following date: August 24, 2014-August 27, 2014  Caregiver's signature: ________________________________________  Date: ______________________________________________________ Document Released: 03/12/2001 Document Revised: 12/09/2011 Document Reviewed: 09/16/2005 ExitCare Patient Information 2015 [place], [place]. This information is not intended to replace advice given to you by your health care provider. Make sure you discuss any questions you have with your health care provider.  DISCHARGE INSTRUCTIONS: D&C / D&E The following instructions have been prepared to help you care for yourself upon your return home.  May take Ibuprofen after 9:30 pm as needed for cramps.  Personal hygiene:  Use sanitary pads for vaginal drainage, not tampons.  Shower the day after your procedure.  NO tub baths, pools or Jacuzzis for 2-3 weeks.  Wipe front to back after using the bathroom.  Activity and limitations:  Do NOT drive or operate any equipment for 24 hours. The effects of anesthesia are still present and drowsiness may result.  Do NOT rest in bed all day.  Walking is encouraged.  Walk up and down stairs slowly.  You may resume your normal activity in one to two days or as indicated by your physician.  Sexual activity: NO intercourse for at least 2 weeks after the procedure, or as indicated by your physician.  Diet: Eat a light meal as desired this evening. You may resume your usual diet tomorrow.  Return to work: You may resume your work activities in one to two days or as indicated by your doctor.  What to expect after your surgery: Expect to have vaginal bleeding/discharge for 2-3 days and spotting for up to 10 days. It is not unusual to have soreness for up to 1-2 weeks. You may have a slight burning sensation when you urinate for the  first day. Mild cramps may continue for a couple of days. You may have a regular period in 2-6 weeks.  Call your doctor for any of the following:  Excessive vaginal bleeding, saturating and changing one pad every hour.  Inability to urinate 6 hours after discharge from hospital.  Pain not relieved by pain medication.  Fever of 100.4 F or greater.  Unusual vaginal discharge or odor.   Call for an appointment:    Patients signature: ______________________  Nurses signature ________________________  Support person's signature_______________________   CALL  IF TEMP>100.4, NOTHING PER VAGINA X 2 WK, CALL IF SOAKING A MAXI  PAD EVERY HOUR OR MORE FREQUENTLYCALL  IF TEMP>100.4, NOTHING PER VAGINA X 2 WK, CALL IF SOAKING A MAXI  PAD EVERY HOUR OR MORE FREQUENTLY

## 2014-08-24 NOTE — Anesthesia Postprocedure Evaluation (Signed)
Anesthesia Post Note  Patient: Shannon Duncan  Procedure(s) Performed: Procedure(s) (LRB): DILATATION AND EVACUATION (D&E) 2ND TRIMESTER (N/A) OPERATIVE ULTRASOUND (N/A)  Anesthesia type: General  Patient location: PACU  Post pain: Pain level controlled  Post assessment: Post-op Vital signs reviewed  Last Vitals:  Filed Vitals:   08/24/14 1630  BP: 112/52  Pulse: 47  Temp:   Resp: 14    Post vital signs: Reviewed  Level of consciousness: sedated  Complications: No apparent anesthesia complications

## 2014-08-25 ENCOUNTER — Encounter (HOSPITAL_COMMUNITY): Payer: Self-pay | Admitting: Obstetrics and Gynecology

## 2014-08-25 NOTE — Op Note (Signed)
Shannon Duncan, Shannon Duncan              ACCOUNT NO.:  [id number]  MEDICAL RECORD NO.:  [id number]  LOCATION:  [facility]                          FACILITY:  [facility]  PHYSICIAN:  Servando Salina, M.D.DATE OF BIRTH:  Jan 20, 1981  DATE OF PROCEDURE:  08/24/2014 DATE OF DISCHARGE:  08/24/2014                              OPERATIVE REPORT   PREOPERATIVE DIAGNOSIS:  Intrauterine gestation at 13-1/2 to 14 weeks pregnancy, fetal anomaly/cystic hygroma.  POSTOPERATIVE DIAGNOSIS:  Cystic hygroma, fetal anomaly, intrauterine gestation at 13-1/2 to 14 weeks.  PROCEDURE:  Ultrasound-guided second-trimester suction, dilation, and evacuation with chromosomal analysis.  ANESTHESIA:  General, paracervical block.  SURGEON:  Servando Salina, M.D.  ASSISTANT:  None.  DESCRIPTION OF PROCEDURE:  Under adequate general anesthesia, the patient was placed in the dorsal lithotomy position.  The patient had a laminaria placement in the office which was removed along with 2 small sponges.  The patient was then sterilely prepped and draped in usual fashion.  Bladder was catheterized for moderate amount of urine. Examination revealed an anteverted uterus about 12-week size.  No adnexal masses could be appreciated.  A bivalve speculum was placed in vagina.  A 20 mL of 1% Nesacaine was injected paracervically at the 3 and 9 o'clock positions.  The anterior lip of the cervix was grasped with a single-tooth tenaculum.  The cervix was easily dilated up to #47 [facility] dilator.  A #14 suction cannula under ultrasound guidance was inserted. Fetus with large cystic hygroma was identified. Amniotic fluid was initially removed.    Suction brought the parts of the placenta out, at which time, a large ring forceps were then utilized with ultrasound guidance to remove the placenta as well as fetal parts. This was continued until all parts including the calvarium   was also subsequently removed.  With the ring clamp, the cavity  was then suctioned.  All instruments were then removed from the vagina, and a sterile transvaginal probe for the ultrasound was then used to better assess the cavity.  There was some fluid left in the cavity and some bright calcification appearing in the fundal posterior region.  The ultrasound then did placed transabdominally and using a #10 suction cannula and a large curette, the cavity was gently suctioned and curetted.  A large clump of what appears to be placental tissue was removed at which time, using a #10 suction cannula and ultrasound guidance, the cavity was once again gently suctioned and subsequently curetted, then much linear endometrial stripe was then noted.  At this point, the procedure was felt to be completed.  All instruments were then removed from the vagina.  SPECIMEN:  Fetal parts,  Placenta sent to  to Pathology. Portion sent  for chromosomal analysis.  COUNTS:  Sponge and instrument counts x2 was correct.  COMPLICATION:  None. Intraoperative fluid: 1000 ml Urine output: 75 ml EBL: 100 mL.  The patient tolerated the procedure well and was transferred to recovery in stable condition.     Servando Salina, M.D.     [place]/MEDQ  D:  08/24/2014  T:  08/25/2014  Job:  [id number]

## 2014-09-08 LAB — CHROMOSOME STD, POC(TISSUE)-NCBH

## 2015-05-04 LAB — OB RESULTS CONSOLE HIV ANTIBODY (ROUTINE TESTING): HIV: NONREACTIVE

## 2015-05-04 LAB — OB RESULTS CONSOLE HEPATITIS B SURFACE ANTIGEN: Hepatitis B Surface Ag: NEGATIVE

## 2015-05-04 LAB — OB RESULTS CONSOLE RPR: RPR: NONREACTIVE

## 2015-05-04 LAB — OB RESULTS CONSOLE ABO/RH: RH Type: POSITIVE

## 2015-05-04 LAB — OB RESULTS CONSOLE RUBELLA ANTIBODY, IGM: RUBELLA: IMMUNE

## 2015-05-04 LAB — OB RESULTS CONSOLE GC/CHLAMYDIA
CHLAMYDIA, DNA PROBE: NEGATIVE
Gonorrhea: NEGATIVE

## 2015-05-04 LAB — OB RESULTS CONSOLE ANTIBODY SCREEN: Antibody Screen: NEGATIVE

## 2015-05-05 ENCOUNTER — Encounter (HOSPITAL_COMMUNITY): Payer: Self-pay | Admitting: *Deleted

## 2015-10-01 NOTE — L&D Delivery Note (Addendum)
Delivery Note At 1:50 AM, on December 03, 2014, a viable female "Shannon Duncan" was delivered via Vaginal, Spontaneous Delivery (Presentation: Left Occiput Anterior with restitution to LOT). Shoulders delivered easily and mother encouraged to embrace infant and delivery remainder of body.  Infant placed on mother's chest were she was with good tone and spontaneous cry.  Nurses gave tactile stimulation and infant APGAR: 9, 9. Cord clamped, cut, and blood collected. Placenta delivered spontaneously and noted to be intact with 3VC upon inspection.  Patient requests no uterotonics for postpartum bleeding.  Vaginal inspection revealed no lacerations.  Fundus firm, below the umbilicus, and bleeding scant.  Mother hemodynamically stable and infant skin to skin prior to provider exit.  Mother desires permanent sterilization for birth control method and placed on NPO status.  Mother opts to breast and bottlefeed.  Infant weight at one hour of life: 6lbs 1.2oz, 18.5in.   Anesthesia: Epidural  Episiotomy: None Lacerations: None Suture Repair: None Est. Blood Loss (mL): 325  Mom to postpartum.  Baby to Couplet care / Skin to Skin.  Maryann Conners MSN, CNM 12/03/2015, 2:09 AM

## 2015-10-18 ENCOUNTER — Other Ambulatory Visit: Payer: Self-pay | Admitting: Obstetrics and Gynecology

## 2015-11-08 ENCOUNTER — Encounter (HOSPITAL_COMMUNITY): Payer: Self-pay | Admitting: *Deleted

## 2015-11-08 ENCOUNTER — Inpatient Hospital Stay (HOSPITAL_COMMUNITY)
Admission: AD | Admit: 2015-11-08 | Discharge: 2015-11-08 | Disposition: A | Discharge status: Home / Self Care | Payer: Medicaid Other | Source: Ambulatory Visit | Attending: Obstetrics and Gynecology | Admitting: Obstetrics and Gynecology

## 2015-11-08 DIAGNOSIS — E860 Dehydration: Secondary | ICD-10-CM | POA: Diagnosis not present

## 2015-11-08 DIAGNOSIS — Z3A35 35 weeks gestation of pregnancy: Secondary | ICD-10-CM | POA: Diagnosis not present

## 2015-11-08 DIAGNOSIS — F319 Bipolar disorder, unspecified: Secondary | ICD-10-CM | POA: Insufficient documentation

## 2015-11-08 DIAGNOSIS — O99333 Smoking (tobacco) complicating pregnancy, third trimester: Secondary | ICD-10-CM | POA: Diagnosis not present

## 2015-11-08 DIAGNOSIS — O26893 Other specified pregnancy related conditions, third trimester: Secondary | ICD-10-CM | POA: Insufficient documentation

## 2015-11-08 DIAGNOSIS — O479 False labor, unspecified: Secondary | ICD-10-CM

## 2015-11-08 LAB — URINALYSIS, ROUTINE W REFLEX MICROSCOPIC
Bilirubin Urine: NEGATIVE
Glucose, UA: NEGATIVE mg/dL
Hgb urine dipstick: NEGATIVE
Ketones, ur: 15 mg/dL — AB
LEUKOCYTES UA: NEGATIVE
NITRITE: NEGATIVE
PH: 6 (ref 5.0–8.0)
Protein, ur: NEGATIVE mg/dL
Specific Gravity, Urine: 1.01 (ref 1.005–1.030)

## 2015-11-08 LAB — OB RESULTS CONSOLE GBS: STREP GROUP B AG: NEGATIVE

## 2015-11-08 MED ORDER — LACTATED RINGERS IV BOLUS (SEPSIS)
1000.0000 mL | Freq: Once | INTRAVENOUS | Status: AC
  Administered 2015-11-08: 1000 mL via INTRAVENOUS

## 2015-11-08 NOTE — MAU Provider Note (Signed)
History    Shannon Duncan, 35yo, [id number], 35.5 wks presents to MAU announced, was seen earlier today at [facility] for return OB visit and was sent for further evaluation after a 2 minute deceleration into the 60-s was witness on NST in the office.   Pt was also being followed closely for Blood pressure evaluation.  Pt has a histroy of pre-eclampsia in a previous pregnancy, has had occasional elevated blood pressures during this pregnancy and has recently reported HA that come and go and resolve with tylenol.    PIH labs obtained at [facility] earlier today: CMP, PCR, and CBC; SVE: 0/30/-3.  Denies HA, blurred vision or epigastric pain.  Drinks 2 L water/day.   Patient Active Problem List   Diagnosis Date Noted  . [redacted] weeks gestation of pregnancy   . Fetal cystic hygroma   . Hydrops fetalis in first trimester, antepartum     Chief Complaint  Patient presents with  . decel in fetal heart rate    HPI  OB History    Gravida Para Term Preterm AB TAB SAB Ectopic Multiple Living   4 1 0  2  1   1       Past Medical History  Diagnosis Date  . Asthma   . Panic attack   . Bipolar 1 disorder     Past Surgical History  Procedure Laterality Date  . No past surgeries    . Dilation and evacuation N/A 08/24/2014    Procedure: DILATATION AND EVACUATION (D&E) 2ND TRIMESTER;  Surgeon: Marvene Staff, MD;  Location: [facility];  Service: Gynecology;  Laterality: N/A;  1 hr.  . Operative ultrasound N/A 08/24/2014    Procedure: OPERATIVE ULTRASOUND;  Surgeon: Marvene Staff, MD;  Location: [facility];  Service: Gynecology;  Laterality: N/A;    No family history on file.  Social History  Substance Use Topics  . Smoking status: Current Every Day Smoker    Types: Cigarettes  . Smokeless tobacco: Not on file  . Alcohol Use: No    Allergies: No Known Allergies  Prescriptions prior to admission  Medication Sig Dispense Refill Last Dose  . ibuprofen (ADVIL,MOTRIN) 800 MG tablet  Take 1 tablet (800 mg total) by mouth every 8 (eight) hours as needed. 30 tablet 0   . LORazepam (ATIVAN) 1 MG tablet Take 1 mg by mouth every 6 (six) hours as needed for anxiety.   08/24/2014 at 1320  . oxyCODONE-acetaminophen (ROXICET) 5-325 MG per tablet Take 1 tablet by mouth every 6 (six) hours as needed for severe pain. 30 tablet 0     ROS Physical Exam    unknown if currently breastfeeding.  Filed Vitals:   11/08/15 1143 11/08/15 1153 11/08/15 1203 11/08/15 1213  BP: 114/68 123/60 114/60 114/43  Pulse: 64 75 62 76     Results for orders placed or performed during the hospital encounter of 11/08/15 (from the past 24 hour(s))  Urinalysis, Routine w reflex microscopic (not at [facility])     Status: Abnormal   Collection Time: 11/08/15 12:20 PM  Result Value Ref Range   Color, Urine YELLOW YELLOW   APPearance CLEAR CLEAR   Specific Gravity, Urine 1.010 1.005 - 1.030   pH 6.0 5.0 - 8.0   Glucose, UA NEGATIVE NEGATIVE mg/dL   Hgb urine dipstick NEGATIVE NEGATIVE   Bilirubin Urine NEGATIVE NEGATIVE   Ketones, ur 15 (A) NEGATIVE mg/dL   Protein, ur NEGATIVE NEGATIVE mg/dL   Nitrite NEGATIVE NEGATIVE  Leukocytes, UA NEGATIVE NEGATIVE    FHR:  Cat 1 tracing, 130 bpm, moderate variability, +accels, no decels UC: Regular every 4-6 min    Physical Exam  Constitutional: She is oriented to person, place, and time. She appears well-developed and well-nourished.  HENT:  Head: Normocephalic.  Eyes: Pupils are equal, round, and reactive to light.  Neck: Normal range of motion.  Cardiovascular: Normal rate and regular rhythm.   Respiratory: Effort normal and breath sounds normal.  GI: Soft. Bowel sounds are normal.  Musculoskeletal: Normal range of motion.  Neurological: She is alert and oriented to person, place, and time. She has normal reflexes.  No clonus noted  Skin: Skin is warm and dry.  Psychiatric: She has a normal mood and affect. Her behavior is normal. Judgment and  thought content normal.    ED Course  Assessment: IUP  33 Dehydration Contractions 4-6 min, minimally perceived by pt Cervix unchanged  0/30/-3  Interim Plan: Consult with Dr. Cletis Media IV hydration prolonged monitoring May recheck cervix    Final Plan DC Home Preterm labor precautions provided Increase fluid intake  Call PRN, or if symptoms worsens Keep scheduled Return OB appointment    Lavetta Nielsen CNM, MSN 11/08/2015 11:27 AM

## 2015-11-08 NOTE — MAU Note (Signed)
Was at dr's appt today.  Baby is measuring small, BP has been going up, has hx of pre-eclampsia.  Had a decel while being monitored.

## 2015-11-08 NOTE — Discharge Instructions (Signed)
Braxton Hicks Contractions °Contractions of the uterus can occur throughout pregnancy. Contractions are not always a sign that you are in labor.  °WHAT ARE BRAXTON HICKS CONTRACTIONS?  °Contractions that occur before labor are called Braxton Hicks contractions, or false labor. Toward the end of pregnancy (32-34 weeks), these contractions can develop more often and may become more forceful. This is not true labor because these contractions do not result in opening (dilatation) and thinning of the cervix. They are sometimes difficult to tell apart from true labor because these contractions can be forceful and people have different pain tolerances. You should not feel embarrassed if you go to the hospital with false labor. Sometimes, the only way to tell if you are in true labor is for your health care provider to look for changes in the cervix. °If there are no prenatal problems or other health problems associated with the pregnancy, it is completely safe to be sent home with false labor and await the onset of true labor. °HOW CAN YOU TELL THE DIFFERENCE BETWEEN TRUE AND FALSE LABOR? °False Labor °· The contractions of false labor are usually shorter and not as hard as those of true labor.   °· The contractions are usually irregular.   °· The contractions are often felt in the front of the lower abdomen and in the groin.   °· The contractions may go away when you walk around or change positions while lying down.   °· The contractions get weaker and are shorter lasting as time goes on.   °· The contractions do not usually become progressively stronger, regular, and closer together as with true labor.   °True Labor °· Contractions in true labor last 30-70 seconds, become very regular, usually become more intense, and increase in frequency.   °· The contractions do not go away with walking.   °· The discomfort is usually felt in the top of the uterus and spreads to the lower abdomen and low back.   °· True labor can be  determined by your health care provider with an exam. This will show that the cervix is dilating and getting thinner.   °WHAT TO REMEMBER °· Keep up with your usual exercises and follow other instructions given by your health care provider.   °· Take medicines as directed by your health care provider.   °· Keep your regular prenatal appointments.   °· Eat and drink lightly if you think you are going into labor.   °· If Braxton Hicks contractions are making you uncomfortable:   °¨ Change your position from lying down or resting to walking, or from walking to resting.   °¨ Sit and rest in a tub of warm water.   °¨ Drink 2-3 glasses of water. Dehydration may cause these contractions.   °¨ Do slow and deep breathing several times an hour.   °WHEN SHOULD I SEEK IMMEDIATE MEDICAL CARE? °Seek immediate medical care if: °· Your contractions become stronger, more regular, and closer together.   °· You have fluid leaking or gushing from your vagina.   °· You have a fever.   °· You pass blood-tinged mucus.   °· You have vaginal bleeding.   °· You have continuous abdominal pain.   °· You have low back pain that you never had before.   °· You feel your baby's head pushing down and causing pelvic pressure.   °· Your baby is not moving as much as it used to.   °  °This information is not intended to replace advice given to you by your health care provider. Make sure you discuss any questions you have with your health care   provider.   Document Released: 09/16/2005 Document Revised: 09/21/2013 Document Reviewed: 06/28/2013 Elsevier Interactive Patient Education 2016 Elsevier Inc[employer]         Dehydration, Adult Dehydration means your body does not have as much fluid or water as it needs. It happens when you take in less fluid than you lose. Your kidneys, brain, and heart will not work properly without the right amount of fluids.  Dehydration can range from mild to severe. It should be treated right away to help prevent it  from becoming severe. HOME CARE  Drink enough fluid to keep your pee (urine) clear or pale yellow.  Drink water or fluid slowly by taking small sips. You can also try sucking on ice cubes.  Have food or drinks that contain electrolytes. Examples include bananas and sports drinks.  Take over-the-counter and prescription medicines only as told by your doctor.  Prepare oral rehydration solution (ORS) according to the instructions that came with it. Take sips of ORS every 5 minutes until your pee returns to normal.  If you are throwing up (vomiting) or have watery poop (diarrhea), keep trying to drink water, ORS, or both.  If you have watery poop, avoid:  Drinks with caffeine.  Fruit juice.  Milk.  Carbonated soft drinks.  Do not take salt tablets. This can lead to having too much sodium in your body (hypernatremia). GET HELP IF:  You cannot eat or drink without throwing up.  You have had mild watery poop for longer than 24 hours.  You have a fever. GET HELP RIGHT AWAY IF:   You have very strong thirst.  You have very bad watery poop.  You have not peed in 6-8 hours, or you have peed only a small amount of very dark pee.  You have shriveled skin.  You are dizzy, confused, or both.   This information is not intended to replace advice given to you by your health care provider. Make sure you discuss any questions you have with your health care provider.   Document Released: 07/13/2009 Document Revised: 06/07/2015 Document Reviewed: 02/01/2015 Elsevier Interactive Patient Education [employer].

## 2015-12-02 ENCOUNTER — Inpatient Hospital Stay (HOSPITAL_COMMUNITY)
Admission: AD | Admit: 2015-12-02 | Discharge: 2015-12-02 | Disposition: A | Discharge status: Home / Self Care | Payer: Medicaid Other | Source: Ambulatory Visit | Attending: Obstetrics and Gynecology | Admitting: Obstetrics and Gynecology

## 2015-12-02 ENCOUNTER — Encounter (HOSPITAL_COMMUNITY): Payer: Self-pay | Admitting: *Deleted

## 2015-12-02 ENCOUNTER — Inpatient Hospital Stay (HOSPITAL_COMMUNITY)
Admission: AD | Admit: 2015-12-02 | Discharge: 2015-12-04 | DRG: 775 | Disposition: A | Discharge status: Home / Self Care | Payer: Medicaid Other | Source: Ambulatory Visit | Attending: Obstetrics and Gynecology | Admitting: Obstetrics and Gynecology

## 2015-12-02 DIAGNOSIS — Z3A39 39 weeks gestation of pregnancy: Secondary | ICD-10-CM | POA: Insufficient documentation

## 2015-12-02 DIAGNOSIS — O9952 Diseases of the respiratory system complicating childbirth: Secondary | ICD-10-CM | POA: Diagnosis present

## 2015-12-02 DIAGNOSIS — F319 Bipolar disorder, unspecified: Secondary | ICD-10-CM | POA: Diagnosis present

## 2015-12-02 DIAGNOSIS — O479 False labor, unspecified: Secondary | ICD-10-CM

## 2015-12-02 DIAGNOSIS — Z87891 Personal history of nicotine dependence: Secondary | ICD-10-CM | POA: Insufficient documentation

## 2015-12-02 DIAGNOSIS — F32A Depression, unspecified: Secondary | ICD-10-CM | POA: Diagnosis present

## 2015-12-02 DIAGNOSIS — F329 Major depressive disorder, single episode, unspecified: Secondary | ICD-10-CM | POA: Diagnosis present

## 2015-12-02 DIAGNOSIS — J45909 Unspecified asthma, uncomplicated: Secondary | ICD-10-CM | POA: Diagnosis present

## 2015-12-02 DIAGNOSIS — D573 Sickle-cell trait: Secondary | ICD-10-CM | POA: Diagnosis present

## 2015-12-02 DIAGNOSIS — O9902 Anemia complicating childbirth: Principal | ICD-10-CM | POA: Diagnosis present

## 2015-12-02 DIAGNOSIS — O99344 Other mental disorders complicating childbirth: Secondary | ICD-10-CM | POA: Diagnosis present

## 2015-12-02 NOTE — MAU Provider Note (Signed)
History    Shannon Duncan is a 35y.o. [id number] at 39.1wks who presents, unannounced, for contractions.  Patient states that contractions started at 0500 and have progressively gotten more frequent and intense.  Patient endorses fetal movement and some bloody show, but is unsure if she has LOF.  Patient denies issues during the pregnancy, but does report an incident of high blood pressure that resolved once "put on bed rest."   Patient Active Problem List   Diagnosis Date Noted  . Braxton Hick's contraction 11/08/2015  . [redacted] weeks gestation of pregnancy   . Fetal cystic hygroma   . Hydrops fetalis in first trimester, antepartum     Chief Complaint  Patient presents with  . Contractions   HPI  OB History    Gravida Para Term Preterm AB TAB SAB Ectopic Multiple Living   5 1 0  3 2 1   1       Past Medical History  Diagnosis Date  . Asthma   . Panic attack   . Bipolar 1 disorder [facility])     Past Surgical History  Procedure Laterality Date  . Dilation and evacuation N/A 08/24/2014    Procedure: DILATATION AND EVACUATION (D&E) 2ND TRIMESTER;  Surgeon: Marvene Staff, MD;  Location: [facility];  Service: Gynecology;  Laterality: N/A;  1 hr.  . Operative ultrasound N/A 08/24/2014    Procedure: OPERATIVE ULTRASOUND;  Surgeon: Marvene Staff, MD;  Location: [facility];  Service: Gynecology;  Laterality: N/A;    No family history on file.  Social History  Substance Use Topics  . Smoking status: Former Smoker    Types: Cigarettes  . Smokeless tobacco: None  . Alcohol Use: No    Allergies: No Known Allergies  Prescriptions prior to admission  Medication Sig Dispense Refill Last Dose  . Prenatal Vit-Fe Fumarate-FA (PRENATAL MULTIVITAMIN) TABS tablet Take 1 tablet by mouth daily at 12 noon.   11/07/2015 at Unknown time    ROS  See HPI Above Physical Exam   Blood pressure 118/62, pulse 59, temperature 97.5 F (36.4 C), temperature source Oral, resp. rate 18, height 5\' 9"   (1.753 m), weight 72.122 kg (159 lb), unknown if currently breastfeeding.  No results found for this or any previous visit (from the past 24 hour(s)).  Physical Exam  Constitutional: She is oriented to person, place, and time. She appears well-developed and well-nourished.  HENT:  Head: Normocephalic and atraumatic.  Eyes: Conjunctivae are normal.  Neck: Normal range of motion.  Cardiovascular: Normal rate.   Respiratory: Effort normal.  GI: Soft.  Musculoskeletal: Normal range of motion.  Neurological: She is alert and oriented to person, place, and time.  Skin: Skin is warm and dry.   SVE: 2/70/-2 Soft, Posterior Exam same as office visit  FHR:120 bpm, Mod Var, -Decels, +Accels UC: Q2-40min, palpates moderate ED Course  Assessment: IUP at 39.1wks Cat I FT Contractions  Plan: -Given Early Labor Options:  A: Home with or w/o therapeutic rest B: Ambulate and reassess C: Pain medication and reassess  -Patient opts for A w/o rest -Can discharge after reactive NST -Labor Precautions -Encouraged to call prior to arrival if back [place], MSN 12/02/2015 7:40 PM

## 2015-12-02 NOTE — Discharge Instructions (Signed)
Braxton Hicks Contractions °Contractions of the uterus can occur throughout pregnancy. Contractions are not always a sign that you are in labor.  °WHAT ARE BRAXTON HICKS CONTRACTIONS?  °Contractions that occur before labor are called Braxton Hicks contractions, or false labor. Toward the end of pregnancy (32-34 weeks), these contractions can develop more often and may become more forceful. This is not true labor because these contractions do not result in opening (dilatation) and thinning of the cervix. They are sometimes difficult to tell apart from true labor because these contractions can be forceful and people have different pain tolerances. You should not feel embarrassed if you go to the hospital with false labor. Sometimes, the only way to tell if you are in true labor is for your health care provider to look for changes in the cervix. °If there are no prenatal problems or other health problems associated with the pregnancy, it is completely safe to be sent home with false labor and await the onset of true labor. °HOW CAN YOU TELL THE DIFFERENCE BETWEEN TRUE AND FALSE LABOR? °False Labor °· The contractions of false labor are usually shorter and not as hard as those of true labor.   °· The contractions are usually irregular.   °· The contractions are often felt in the front of the lower abdomen and in the groin.   °· The contractions may go away when you walk around or change positions while lying down.   °· The contractions get weaker and are shorter lasting as time goes on.   °· The contractions do not usually become progressively stronger, regular, and closer together as with true labor.   °True Labor °· Contractions in true labor last 30-70 seconds, become very regular, usually become more intense, and increase in frequency.   °· The contractions do not go away with walking.   °· The discomfort is usually felt in the top of the uterus and spreads to the lower abdomen and low back.   °· True labor can be  determined by your health care provider with an exam. This will show that the cervix is dilating and getting thinner.   °WHAT TO REMEMBER °· Keep up with your usual exercises and follow other instructions given by your health care provider.   °· Take medicines as directed by your health care provider.   °· Keep your regular prenatal appointments.   °· Eat and drink lightly if you think you are going into labor.   °· If Braxton Hicks contractions are making you uncomfortable:   °¨ Change your position from lying down or resting to walking, or from walking to resting.   °¨ Sit and rest in a tub of warm water.   °¨ Drink 2-3 glasses of water. Dehydration may cause these contractions.   °¨ Do slow and deep breathing several times an hour.   °WHEN SHOULD I SEEK IMMEDIATE MEDICAL CARE? °Seek immediate medical care if: °· Your contractions become stronger, more regular, and closer together.   °· You have fluid leaking or gushing from your vagina.   °· You have a fever.   °· You pass blood-tinged mucus.   °· You have vaginal bleeding.   °· You have continuous abdominal pain.   °· You have low back pain that you never had before.   °· You feel your baby's head pushing down and causing pelvic pressure.   °· Your baby is not moving as much as it used to.   °  °This information is not intended to replace advice given to you by your health care provider. Make sure you discuss any questions you have with your health care   provider. °  °Document Released: 09/16/2005 Document Revised: 09/21/2013 Document Reviewed: 06/28/2013 °Elsevier Interactive Patient Education ©2016 [employer] ° °

## 2015-12-02 NOTE — MAU Note (Signed)
Contractions since this morning.  Denies leaking fluid or vaginal bleeding. Did lose mucous plug

## 2015-12-03 ENCOUNTER — Encounter (HOSPITAL_COMMUNITY): Payer: Self-pay

## 2015-12-03 ENCOUNTER — Inpatient Hospital Stay (HOSPITAL_COMMUNITY): Payer: Medicaid Other | Admitting: Anesthesiology

## 2015-12-03 DIAGNOSIS — F329 Major depressive disorder, single episode, unspecified: Secondary | ICD-10-CM | POA: Diagnosis present

## 2015-12-03 DIAGNOSIS — O9952 Diseases of the respiratory system complicating childbirth: Secondary | ICD-10-CM | POA: Diagnosis present

## 2015-12-03 DIAGNOSIS — O99344 Other mental disorders complicating childbirth: Secondary | ICD-10-CM | POA: Diagnosis present

## 2015-12-03 DIAGNOSIS — J45909 Unspecified asthma, uncomplicated: Secondary | ICD-10-CM | POA: Diagnosis present

## 2015-12-03 DIAGNOSIS — Z3A39 39 weeks gestation of pregnancy: Secondary | ICD-10-CM | POA: Diagnosis not present

## 2015-12-03 DIAGNOSIS — F319 Bipolar disorder, unspecified: Secondary | ICD-10-CM | POA: Diagnosis present

## 2015-12-03 DIAGNOSIS — D573 Sickle-cell trait: Secondary | ICD-10-CM | POA: Diagnosis present

## 2015-12-03 DIAGNOSIS — F32A Depression, unspecified: Secondary | ICD-10-CM | POA: Diagnosis present

## 2015-12-03 DIAGNOSIS — Z87891 Personal history of nicotine dependence: Secondary | ICD-10-CM | POA: Diagnosis not present

## 2015-12-03 DIAGNOSIS — O9902 Anemia complicating childbirth: Secondary | ICD-10-CM | POA: Diagnosis present

## 2015-12-03 LAB — TYPE AND SCREEN
ABO/RH(D): O POS
ANTIBODY SCREEN: NEGATIVE

## 2015-12-03 LAB — CBC
HEMATOCRIT: 32.4 % — AB (ref 36.0–46.0)
HEMOGLOBIN: 11.4 g/dL — AB (ref 12.0–15.0)
MCH: 29.5 pg (ref 26.0–34.0)
MCHC: 35.2 g/dL (ref 30.0–36.0)
MCV: 83.7 fL (ref 78.0–100.0)
PLATELETS: 165 10*3/uL (ref 150–400)
RBC: 3.87 MIL/uL (ref 3.87–5.11)
RDW: 12 % (ref 11.5–15.5)
WBC: 9.6 10*3/uL (ref 4.0–10.5)

## 2015-12-03 LAB — RPR: RPR Ser Ql: NONREACTIVE

## 2015-12-03 MED ORDER — OXYTOCIN BOLUS FROM INFUSION: 500.0000 mL | INTRAVENOUS | Status: DC

## 2015-12-03 MED ORDER — SIMETHICONE 80 MG PO CHEW: 80.0000 mg | CHEWABLE_TABLET | ORAL | Status: DC | PRN

## 2015-12-03 MED ORDER — LIDOCAINE HCL (PF) 1 % IJ SOLN
INTRAMUSCULAR | Status: DC | PRN
  Administered 2015-12-03 (×2): 4 mL via EPIDURAL

## 2015-12-03 MED ORDER — WITCH HAZEL-GLYCERIN EX PADS: 1.0000 "application " | MEDICATED_PAD | CUTANEOUS | Status: DC | PRN

## 2015-12-03 MED ORDER — FENTANYL CITRATE (PF) 100 MCG/2ML IJ SOLN: 50.0000 ug | INTRAMUSCULAR | Status: DC | PRN

## 2015-12-03 MED ORDER — PHENYLEPHRINE 40 MCG/ML (10ML) SYRINGE FOR IV PUSH (FOR BLOOD PRESSURE SUPPORT)
80.0000 ug | PREFILLED_SYRINGE | INTRAVENOUS | Status: DC | PRN
  Filled 2015-12-03: qty 20
  Filled 2015-12-03: qty 2

## 2015-12-03 MED ORDER — ONDANSETRON HCL 4 MG/2ML IJ SOLN: 4.0000 mg | INTRAMUSCULAR | Status: DC | PRN

## 2015-12-03 MED ORDER — ONDANSETRON HCL 4 MG PO TABS: 4.0000 mg | ORAL_TABLET | ORAL | Status: DC | PRN

## 2015-12-03 MED ORDER — FENTANYL 2.5 MCG/ML BUPIVACAINE 1/10 % EPIDURAL INFUSION (WH - ANES)
14.0000 mL/h | INTRAMUSCULAR | Status: DC | PRN
  Administered 2015-12-03 (×2): 14 mL/h via EPIDURAL
  Filled 2015-12-03: qty 125

## 2015-12-03 MED ORDER — ACETAMINOPHEN 325 MG PO TABS: 650.0000 mg | ORAL_TABLET | ORAL | Status: DC | PRN

## 2015-12-03 MED ORDER — SERTRALINE HCL 25 MG PO TABS
25.0000 mg | ORAL_TABLET | Freq: Every day | ORAL | Status: DC
  Administered 2015-12-03 – 2015-12-04 (×2): 25 mg via ORAL
  Filled 2015-12-03 (×2): qty 1

## 2015-12-03 MED ORDER — DIBUCAINE 1 % RE OINT: 1.0000 "application " | TOPICAL_OINTMENT | RECTAL | Status: DC | PRN

## 2015-12-03 MED ORDER — LACTATED RINGERS IV SOLN: 500.0000 mL | INTRAVENOUS | Status: DC | PRN

## 2015-12-03 MED ORDER — PHENYLEPHRINE 40 MCG/ML (10ML) SYRINGE FOR IV PUSH (FOR BLOOD PRESSURE SUPPORT)
80.0000 ug | PREFILLED_SYRINGE | INTRAVENOUS | Status: DC | PRN
  Filled 2015-12-03: qty 2

## 2015-12-03 MED ORDER — EPHEDRINE 5 MG/ML INJ
10.0000 mg | INTRAVENOUS | Status: DC | PRN
Start: 2015-12-03 — End: 2015-12-03
  Filled 2015-12-03: qty 2

## 2015-12-03 MED ORDER — LANOLIN HYDROUS EX OINT: TOPICAL_OINTMENT | CUTANEOUS | Status: DC | PRN

## 2015-12-03 MED ORDER — LACTATED RINGERS IV SOLN
500.0000 mL | Freq: Once | INTRAVENOUS | Status: AC
  Administered 2015-12-03: 500 mL via INTRAVENOUS

## 2015-12-03 MED ORDER — DIPHENHYDRAMINE HCL 50 MG/ML IJ SOLN: 12.5000 mg | INTRAMUSCULAR | Status: DC | PRN

## 2015-12-03 MED ORDER — IBUPROFEN 600 MG PO TABS
600.0000 mg | ORAL_TABLET | Freq: Four times a day (QID) | ORAL | Status: DC
  Administered 2015-12-03 – 2015-12-04 (×5): 600 mg via ORAL
  Filled 2015-12-03 (×5): qty 1

## 2015-12-03 MED ORDER — LACTATED RINGERS IV SOLN
INTRAVENOUS | Status: DC
  Administered 2015-12-03: via INTRAVENOUS

## 2015-12-03 MED ORDER — CITRIC ACID-SODIUM CITRATE 334-500 MG/5ML PO SOLN: 30.0000 mL | ORAL | Status: DC | PRN

## 2015-12-03 MED ORDER — OXYTOCIN 10 UNIT/ML IJ SOLN
2.5000 [IU]/h | INTRAMUSCULAR | Status: DC
  Filled 2015-12-03: qty 10

## 2015-12-03 MED ORDER — TETANUS-DIPHTH-ACELL PERTUSSIS 5-2.5-18.5 LF-MCG/0.5 IM SUSP: 0.5000 mL | Freq: Once | INTRAMUSCULAR | Status: DC

## 2015-12-03 MED ORDER — BENZOCAINE-MENTHOL 20-0.5 % EX AERO
1.0000 "application " | INHALATION_SPRAY | CUTANEOUS | Status: DC | PRN
  Filled 2015-12-03: qty 56

## 2015-12-03 MED ORDER — PRENATAL MULTIVITAMIN CH
1.0000 | ORAL_TABLET | Freq: Every day | ORAL | Status: DC
  Administered 2015-12-04: 1 via ORAL
  Filled 2015-12-03: qty 1

## 2015-12-03 MED ORDER — ONDANSETRON HCL 4 MG/2ML IJ SOLN: 4.0000 mg | Freq: Four times a day (QID) | INTRAMUSCULAR | Status: DC | PRN

## 2015-12-03 MED ORDER — SENNOSIDES-DOCUSATE SODIUM 8.6-50 MG PO TABS
2.0000 | ORAL_TABLET | ORAL | Status: DC
  Administered 2015-12-03: 2 via ORAL
  Filled 2015-12-03: qty 2

## 2015-12-03 MED ORDER — DIPHENHYDRAMINE HCL 25 MG PO CAPS: 25.0000 mg | ORAL_CAPSULE | Freq: Four times a day (QID) | ORAL | Status: DC | PRN

## 2015-12-03 MED ORDER — ZOLPIDEM TARTRATE 5 MG PO TABS: 5.0000 mg | ORAL_TABLET | Freq: Every evening | ORAL | Status: DC | PRN

## 2015-12-03 MED ORDER — ACETAMINOPHEN 325 MG PO TABS
650.0000 mg | ORAL_TABLET | ORAL | Status: DC | PRN
  Administered 2015-12-03: 650 mg via ORAL
  Filled 2015-12-03: qty 2

## 2015-12-03 MED ORDER — EPHEDRINE 5 MG/ML INJ
10.0000 mg | INTRAVENOUS | Status: DC | PRN
  Filled 2015-12-03: qty 2

## 2015-12-03 MED ORDER — LIDOCAINE HCL (PF) 1 % IJ SOLN
30.0000 mL | INTRAMUSCULAR | Status: DC | PRN
  Filled 2015-12-03: qty 30

## 2015-12-03 NOTE — H&P (Signed)
Shannon Duncan is a 35 y.o. female, G5P1031 at 39.2 weeks, presenting for active labor.  Patient evaluated earlier for contractions and requested discharge to home.  Returns with increased contraction intensity and frequency.  Endorses fetal movement, but is unsure of LOF.  Requests epidural for pain mgmt and is GBS negative.  Patient pregnancy history significant for depression, anxiety, asthma, BPD, elevated bp incident, sickle cell trait, and anemia.  Patient does desire a BTL, has valid papers on file (10/12/2015), and last ate at 2100 on 12/02/2015.   Patient Active Problem List   Diagnosis Date Noted  . Braxton Hick's contraction 11/08/2015  . [redacted] weeks gestation of pregnancy   . Fetal cystic hygroma   . Hydrops fetalis in first trimester, antepartum     History of present pregnancy: Patient entered care at 8.6 weeks.   EDC of 12/08/2015 was established by Definite LMP of 03/03/2015.   Anatomy scan:  19.4 weeks, with normal findings and an anterior placenta.   Additional [place] evaluations:   -Anatomy: Singleton, vertex. Placental edge1.3 cm low lying Posterior fossa, CSP, 4 chamber heart not well visualized. Low lying placenta -F/U Anatomy: Breech, anterior placenta. Placental edge 4.6 cm from os All anatomy seen. anatomy c/c EFW=47%  -31.6wks S<D: Singleton pregnancy. Vertex presentation. Anterior placenta. AFI is normal-20th%. Cervix 4.17 cm. Growth 28%. Adnexa are unremarkable -36.4wks: [place]: SIUP. Vertex presentation.Anterior placenta. EFW 2668g, 24.6%tile, AFI is normal-35%TH. cervix not measured-per protocol. Adenexa are unremarkable  Significant prenatal events: 1st Trimester: None 2nd Trimester: Patient stopped taking all her psych medications prior to therapist evaluation, but remained stable after discontinuation.  3rd Trimester: Patient currently taking zoloft 10 mg BID.  Common pregnancy discomforts.  Frequent irregular contractions. Reports occasional elevated bp via log. Reports  swelling of hands/feet.   Last evaluation:  12/01/2015 in office by V. Cira Servant, CNM VE: 1/70/-1,  BP 116/72, Wt 159lbs, TWG 35lbs   OB History    Gravida Para Term Preterm AB TAB SAB Ectopic Multiple Living   5 1 1  3 2 1   1      10/2006-Preterm Female at 35 wks  Past Medical History  Diagnosis Date  . Asthma   . Panic attack   . Bipolar 1 disorder [facility])    Past Surgical History  Procedure Laterality Date  . Dilation and evacuation N/A 08/24/2014    Procedure: DILATATION AND EVACUATION (D&E) 2ND TRIMESTER;  Surgeon: Marvene Staff, MD;  Location: [facility];  Service: Gynecology;  Laterality: N/A;  1 hr.  . Operative ultrasound N/A 08/24/2014    Procedure: OPERATIVE ULTRASOUND;  Surgeon: Marvene Staff, MD;  Location: [facility];  Service: Gynecology;  Laterality: N/A;   Family History: family history is not on file. Social History:  reports that she has quit smoking. Her smoking use included Cigarettes. She does not have any smokeless tobacco history on file. She reports that she uses illicit drugs (Marijuana). She reports that she does not drink alcohol.   Prenatal Transfer Tool  Maternal Diabetes: No Genetic Screening: Normal Maternal Ultrasounds/Referrals: Abnormal:  Findings:   Other: Low Lying Placenta-Resolved Fetal Ultrasounds or other Referrals:  None Maternal Substance Abuse:  No Significant Maternal Medications:  Meds include: Zoloft Significant Maternal Lab Results: Lab values include: Group B Strep negative    ROS:  +Ctx, ? LoF, -VB, +FM  No Known Allergies   Dilation: 5 Effacement (%): 90 Station: -1 unknown if currently breastfeeding.  Membranes appreciated  Physical Exam  Constitutional:  She is oriented to person, place, and time. She appears well-developed and well-nourished. She appears distressed (During Ctx).  HENT:  Head: Normocephalic and atraumatic.  Eyes: Conjunctivae are normal.  Neck: Normal range of motion.  Cardiovascular: Normal  rate.   Respiratory: Effort normal.  GI: Soft.  Musculoskeletal: Normal range of motion. She exhibits no edema.  Neurological: She is alert and oriented to person, place, and time.  Skin: Skin is warm and dry.    [place]: EFW: 6 3/4-7lbs Presentation: Vertex  FHR: 120 bpm, Mod Var, -Decels, +Accels UCs: Q1-21min, Palpates moderate to strong  Prenatal labs: ABO, Rh: O/Positive/-- (08/04 0000) Antibody: Negative (08/04 0000) Rubella:  Immune RPR: Nonreactive (08/04 0000)  HBsAg: Negative (08/04 0000)  HIV: Non-reactive (08/04 0000)  GBS: Negative (02/08 0000) Sickle cell/Hgb electrophoresis:  AS-Trait Pap:  Normal 05/2015 GC:  Negative Chlamydia:  Negative Other:  None    Assessment IUP at 39.2wks Cat I FT Active Labor GBS Negative [place] Trait H/O Chronic Depression Asthma Desires Permanent Sterilization  Plan: Admit to [facility] Routine Labor and Delivery Orders per [facility] Protocol In room to complete assessment and discuss POC: Patient desires epidural Will leave epidural catheter in place after delivery Expectant Mgmt as appropriate Dr.TC to be updated as appropriate  Loann Quill, MSN 12/03/2015, 12:11 AM

## 2015-12-03 NOTE — Anesthesia Postprocedure Evaluation (Signed)
Anesthesia Post Note  Patient: Shannon Duncan  Procedure(s) Performed: * No procedures listed *  Patient location during evaluation: Mother Baby Anesthesia Type: Epidural Level of consciousness: awake Pain management: satisfactory to patient Vital Signs Assessment: post-procedure vital signs reviewed and stable Respiratory status: spontaneous breathing Cardiovascular status: stable Postop Assessment: adequate PO intake, no signs of nausea or vomiting, epidural receding and no headache Anesthetic complications: no Comments: Current pain 3, pain goal 10    Last Vitals:  Filed Vitals:   12/03/15 0350 12/03/15 0435  BP: 124/54 126/64  Pulse: 46 49  Temp: 36.8 C 37 C  Resp: 20 18    Last Pain:  Filed Vitals:   12/03/15 0909  PainSc: 5                  [name]

## 2015-12-03 NOTE — Progress Notes (Signed)
Patient and husband inquired about BTL being performed today or not as patient has been NPO all night. Per RN, I advised pt and husband we are waiting to hear from the surgeon to know if BTL can be performed today and if so, when.  Reassured that we will keep them informed as soon as we know.

## 2015-12-03 NOTE — Progress Notes (Signed)
Pt states that she no longer wishes to have a BTL and wants her peidural catheter removed. Provider notified and permission to remove catheter given.

## 2015-12-03 NOTE — Anesthesia Procedure Notes (Signed)
Epidural Patient location during procedure: OB Start time: 12/03/2015 1:11 AM End time: 12/03/2015 1:14 AM  Staffing Anesthesiologist: Duane Boston Performed by: anesthesiologist   Preanesthetic Checklist Completed: patient identified, site marked, pre-op evaluation, timeout performed, IV checked, risks and benefits discussed and monitors and equipment checked  Epidural Patient position: sitting Prep: DuraPrep Patient monitoring: heart rate, cardiac monitor, continuous pulse ox and blood pressure Approach: midline Location: L2-L3 Injection technique: LOR saline  Needle:  Needle type: Tuohy  Needle gauge: 17 G Needle length: 9 cm Needle insertion depth: 5 cm Catheter size: 20 Guage Catheter at skin depth: 10 cm Test dose: negative and Other  Assessment Events: blood not aspirated, injection not painful, no injection resistance and negative IV test  Additional Notes Informed consent obtained prior to proceeding including risk of failure, 1% risk of PDPH, risk of minor discomfort and bruising.  Discussed rare but serious complications including epidural abscess, permanent nerve injury, epidural hematoma.  Discussed alternatives to epidural analgesia and patient desires to proceed.  Timeout performed pre-procedure verifying patient name, procedure, and platelet count.  Patient tolerated procedure well.

## 2015-12-03 NOTE — Anesthesia Preprocedure Evaluation (Signed)
Anesthesia Evaluation  Patient identified by MRN, date of birth, ID band Patient awake    Reviewed: Allergy & Precautions, NPO status , Patient's Chart, lab work & pertinent test results  Airway Mallampati: II  TM Distance: >3 FB Neck ROM: Full    Dental  (+) Teeth Intact, Dental Advisory Given   Pulmonary asthma , former smoker,    Pulmonary exam normal        Cardiovascular negative cardio ROS Normal cardiovascular exam     Neuro/Psych PSYCHIATRIC DISORDERS Anxiety Bipolar Disorder negative neurological ROS     GI/Hepatic negative GI ROS, Neg liver ROS,   Endo/Other  negative endocrine ROS  Renal/GU negative Renal ROS  negative genitourinary   Musculoskeletal negative musculoskeletal ROS (+)   Abdominal   Peds negative pediatric ROS (+)  Hematology negative hematology ROS (+)   Anesthesia Other Findings   Reproductive/Obstetrics (+) Pregnancy                             Anesthesia Physical Anesthesia Plan  ASA: II  Anesthesia Plan: Epidural   Post-op Pain Management:    Induction:   Airway Management Planned:   Additional Equipment:   Intra-op Plan:   Post-operative Plan:   Informed Consent: I have reviewed the patients History and Physical, chart, labs and discussed the procedure including the risks, benefits and alternatives for the proposed anesthesia with the patient or authorized representative who has indicated his/her understanding and acceptance.   Dental advisory given  Plan Discussed with: Anesthesiologist  Anesthesia Plan Comments:         Anesthesia Quick Evaluation

## 2015-12-03 NOTE — Progress Notes (Signed)
Shannon Duncan   Subjective: Post Partum Day 0 Vaginal delivery, no laceration Patient up ad lib, denies syncope or dizziness. Reports consuming regular diet without issues and denies N/V No issues with urination and reports bleeding is appropriate  Feeding:  breast Contraceptive plan:   BTL schedule for tomorrow  Objective: Temp:  [97.5 F (36.4 C)-98.6 F (37 C)] 98.6 F (37 C) (03/05 0435) Pulse Rate:  [42-137] 49 (03/05 0435) Resp:  [18-20] 18 (03/05 0435) BP: (83-149)/(48-115) 126/64 mmHg (03/05 0435) SpO2:  [97 %-100 %] 100 % (03/05 0145) Weight:  [159 lb (72.122 kg)] 159 lb (72.122 kg) (03/05 0032)  Physical Exam:  General: alert and cooperative Ext: WNL, no significant  edema. No evidence of DVT seen on physical exam. Breast: Soft filling Lungs: CTAB Heart RRR without murmur  Abdomen:  Soft, fundus firm, lochia scant, + bowel sounds, non distended, non tender Lochia: appropriate Uterine Fundus: firm Laceration: healing well    Recent Labs  12/03/15 0015  HGB 11.4*  HCT 32.4*    Assessment S/P Vaginal Delivery-Day 0 Stable  Normal Involution Breastfeeding   Plan: Continue current care Dr. Landry Mellow updated on patient status  BTL tomorrow NPO after MN Breastfeeding and Lactation consult Lactation support   Bruce Mayers, CNM, MSN 12/03/2015, 8:50 AM

## 2015-12-03 NOTE — Progress Notes (Signed)
ALGERIA LEEK MRN: [id number]  Subjective: -Patient s/p epidural.  Reports rectal pressure and perception of contractions.  FOB Bryan at bedside, supportive.   Objective: BP 119/99 mmHg  Pulse 79  Temp(Src) 98.1 F (36.7 C) (Oral)  Resp 18  SpO2 100%      Fetal Monitoring: FHT: 120 bpm, Mod Var, -Decels, +Accels UC: Q1-26min, palpates strong    Vaginal Exam: SVE:   Dilation: 10 Effacement (%): 100 Station: +1 Exam by:: j. Avyan Livesay cnm Membranes:Intact Internal Monitors: None  Augmentation/Induction: Pitocin:None Cytotec: None  Assessment:  IUP at 39.2wks Cat I FT  2nd Stage  Plan: -Prep room for delivery -Will remain in proximity -Plan for AROM and SVD  Fraser Din Reilly Blades,MSN, CNM 12/03/2015, 1:27 AM

## 2015-12-04 ENCOUNTER — Encounter (HOSPITAL_COMMUNITY)
Admission: AD | Disposition: A | Discharge status: Home / Self Care | Payer: Self-pay | Source: Ambulatory Visit | Attending: Obstetrics and Gynecology

## 2015-12-04 LAB — CBC
HCT: 30.6 % — ABNORMAL LOW (ref 36.0–46.0)
HEMOGLOBIN: 10.5 g/dL — AB (ref 12.0–15.0)
MCH: 29 pg (ref 26.0–34.0)
MCHC: 34.3 g/dL (ref 30.0–36.0)
MCV: 84.5 fL (ref 78.0–100.0)
Platelets: 154 10*3/uL (ref 150–400)
RBC: 3.62 MIL/uL — ABNORMAL LOW (ref 3.87–5.11)
RDW: 12.2 % (ref 11.5–15.5)
WBC: 9.8 10*3/uL (ref 4.0–10.5)

## 2015-12-04 SURGERY — LIGATION, FALLOPIAN TUBE, POSTPARTUM
Anesthesia: Choice | Laterality: Bilateral

## 2015-12-04 MED ORDER — SERTRALINE HCL 25 MG PO TABS: 25.0000 mg | ORAL_TABLET | Freq: Every day | ORAL | Status: AC

## 2015-12-04 MED ORDER — IBUPROFEN 600 MG PO TABS: 600.0000 mg | ORAL_TABLET | Freq: Four times a day (QID) | ORAL | Status: DC | PRN

## 2015-12-04 NOTE — Progress Notes (Signed)
Verified with patient and MD the medication patient needed was Zoloft instead of Abilify.  Prescription faxed by midwife.   Clyde Hill Cellar RN

## 2015-12-04 NOTE — Discharge Summary (Signed)
OB Discharge Summary     Patient Name: Shannon Duncan DOB: 01/06/1981 MRN: [id number]  Date of admission: 12/02/2015 Delivering MD: Gavin Pound   Date of discharge: 12/04/2015  Admitting diagnosis: 94 wks active labor Desires sterilization Intrauterine pregnancy: [redacted]w[redacted]d     Secondary diagnosis:  Principal Problem:   SVD (spontaneous vaginal delivery) Active Problems:   Labor and delivery, indication for care   Depression   AS (sickle cell trait) ([place])  Additional problems: None     Discharge diagnosis: Term Pregnancy Delivered                                                                                                Post partum procedures:None--patient declined pp BTL  Augmentation: None  Complications: None  Hospital course:  Onset of Labor With Vaginal Delivery     35 y.o. yo [id number] at [redacted]w[redacted]d was admitted in Active Labor on 12/02/2015. Patient had an uncomplicated labor course as follows:  Membrane Rupture Time/Date: 1:46 AM ,12/03/2015   Intrapartum Procedures: Episiotomy: None [1]                                         Lacerations:  None [1]  Patient had a delivery of a Viable infant. 12/03/2015  Information for the patient's newborn:  Melaya, [place] [employer] [id number]  Delivery Method: Vaginal, Spontaneous Delivery (Filed from Delivery Summary)    Pateint had an uncomplicated postpartum course.  She is ambulating, tolerating a regular diet, passing flatus, and urinating well. Patient is discharged home in stable condition on 12/04/2015, on day 1 at patient request.  She declined pp BTL, and is currently undecided regarding contraceptive choices.  She reports "birth control makes me feel bad and messes up my pap smears."    Physical exam  Filed Vitals:   12/03/15 0350 12/03/15 0435 12/03/15 1700 12/04/15 0500  BP: 124/54 126/64 119/51 120/56  Pulse: 46 49 49 47  Temp: 98.3 F (36.8 C) 98.6 F (37 C) 98 F (36.7 C) 98 F (36.7 C)  TempSrc: Oral Oral Oral Oral   Resp: 20 18 18 18   Height:      Weight:      SpO2:    100%   General: alert Lochia: appropriate Uterine Fundus: firm Incision: Perineum intact DVT Evaluation: No evidence of DVT seen on physical exam. Negative Homan's sign. Labs: CBC Latest Ref Rng 12/04/2015 12/03/2015 08/24/2014  WBC 4.0 - 10.5 K/uL 9.8 9.6 9.0  Hemoglobin 12.0 - 15.0 g/dL 10.5(L) 11.4(L) 12.0  Hematocrit 36.0 - 46.0 % 30.6(L) 32.4(L) 34.3(L)  Platelets 150 - 400 K/uL 154 165 168    CMP Latest Ref Rng 04/18/2009  Glucose 70 - 99 mg/dL 92  BUN 6 - 23 mg/dL 11  Creatinine 0.4 - 1.2 mg/dL 1.08  Sodium 135 - 145 mEq/L 141  Potassium 3.5 - 5.1 mEq/L 3.6  Chloride 96 - 112 mEq/L 107  CO2 19 - 32 mEq/L 27  Calcium 8.4 - 10.5 mg/dL 9.9  Total Protein 6.0 - 8.3 g/dL 8.0  Total Bilirubin 0.3 - 1.2 mg/dL 1.5(H)  Alkaline Phos 39 - 117 U/L 72  AST 0 - 37 U/L 14  ALT 0 - 35 U/L 10    Discharge instruction: per After Visit Summary and "Baby and Me Booklet".  After visit meds:    Medication List    TAKE these medications        albuterol 108 (90 Base) MCG/ACT inhaler  Commonly known as:  PROVENTIL HFA;VENTOLIN HFA  Inhale 1-2 puffs into the lungs every 6 (six) hours as needed for wheezing or shortness of breath.     ibuprofen 600 MG tablet  Commonly known as:  ADVIL,MOTRIN  Take 1 tablet (600 mg total) by mouth every 6 (six) hours as needed.     LORazepam 1 MG tablet  Commonly known as:  ATIVAN  Take 1 mg by mouth 4 (four) times daily. Patient says she can take 1mg  up to 4 times a day     prenatal multivitamin Tabs tablet  Take 1 tablet by mouth daily at 12 noon.     ZOLOFT PO  Take 20 mg by mouth daily. Patient says she takes 2 of the 10 mg zoloft and gets it from dc office.        Diet: routine diet  Activity: Advance as tolerated. Pelvic rest for 6 weeks.   Outpatient follow up:6 weeks Follow up Appt:No future appointments. Follow up Visit:No Follow-up on file.  Postpartum contraception:  Undecided at present  Newborn Data: Live born female  Birth Weight: 6 lb 1.2 oz (2755 g) APGAR: 9, 9  Baby Feeding: Bottle and Breast Disposition:home with mother   12/04/2015 Donnel Saxon, CNM

## 2015-12-04 NOTE — Discharge Instructions (Signed)

## 2015-12-04 NOTE — Clinical Social Work Maternal (Signed)
CLINICAL SOCIAL WORK MATERNAL/CHILD NOTE  Patient Details  Name: Shannon Duncan MRN: [id number] Date of Birth: July 31, 1981  Date:  12/04/2015  Clinical Social Worker Initiating Note:  Lucita Ferrara MSW, LCSW Date/ Time Initiated:  12/04/15/1030     Child's Name:  Claudia Desanctis    Legal Guardian:  Hollie Salk   Need for Interpreter:  None   Date of Referral:  12/03/15     Reason for Referral:  Behavioral Health Issues, including SI    Referral Source:  [facility]   Address:  [place], [place] [address]  Phone number:  [phone]   Household Members:  Minor Children, Spouse   Natural Supports (not living in the home):  Immediate Family   Professional Supports: Dr. 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 Mental Health Concerns: MOB presents with a history of depression, anxiety, bipolar, and postpartum depression. MOB is currently receiving outpatient mental health care with Dr. Toy Care, and has an appointment scheduled for next week, with intentions to re-start medications.    Cognitive State:  Able to Concentrate , Alert , Insightful , Linear Thinking    Mood/Affect:  Happy , Tearful , Calm    CSW Assessment:  CSW received request for consult due to MOB presenting with a history of depression, anxiety, and bipolar.  MOB provided consent for the FOB, MGM, and MGM's husband to remain in the room during the assessment. FOB was observed to be attending to and holding the infant during the assessment.  MOB and FOB were eager for discharge, but MOB was receptive to [place] visit prior to departure.     MOB presented with insight and self-awareness in regards to her  mental health.  Per chart review, MOB presents with a history of depression, anxiety, and bipolar.  MOB stated that she currently receives outpatient mental health services from Dr. Toy Care, and shared that she has a postpartum visit scheduled for next week. MOB shared that she wanted to be proactive and be re-evaluated for medications shortly after the infant's birth.  MOB stated that she was previously prescribed Abilify and Zoloft, but discontinued medication with the +UPT. She shared that she has a history of a previous pregnancy loss, and stated that for these reasons, she was scared and nervous to take any medication that may cause harm to the infant.  MOB stated that she did have one anti-anxiety medication that she was able to take PRN, but denied need to take medication on a regular basis.  MOB reported history of postpartum depression after her first daughter was born in 2008. She discussed combination of events that led to her symptoms.  MOB stated that it was "bad", and shared that this motivated her to be proactive in her mental health care with this transition postpartum. MOB expressed hope that this transition postpartum will be different since "its a different situation", and highlighted the strong support that she receives from the FOB. MOB was also able to recognize that she is not alone, and that she has a support system that can assist her as needs arise.  MOB denied acute symptoms during the pregnancy, and denied presence of  SI for more than 3 years.  Per MOB, she was tearful and overwhelmed the previous event when the infant was crying and difficult to soothe. MOB began to cry as she reflected upon this event.  She confirmed negative core beliefs about herself, and shared that she felt that she was a bad mother.  MOB was receptive to re-structuring this belief, and recognized that she is not a bad mother. She stated that she knows that infants sometimes can cry, and it is not indicative of her  qualities as a mother.  MOB was receptive to making this thought and belief more accurate, and was able to identify her strengths as a mother. MOB was receptive to viewing thoughts as incomplete and inaccurate, and acknowledged how this cognitive technique can be utilized postpartum.    MOB shared that at home she is anticipating heightened anxiety. She discussed tendency to ruminate on the worst case scenarios, even where there is no evidence to support her belief.  Per MOB, she has learned how to distract herself and to disengage from anxious thoughts. She discussed at length how she is able to use adult coloring books, puzzles, and mediation to assist her to focus on the present moment.    MOB denied questions, concerns, or needs as she prepares to discharge home. She stated that she feels well supported, and is looking forward to being in her own space and being able to sleep. MOB presented with awareness of importance of sleeping and engaging in self-care.  MOB expressed comfort in her current ability to cope with mental health complications, and confidence in her current mental health care plan as she transitions postpartum.  MOB acknowledged her increased risk for perinatal mood disorders, and stated that she is able to contact her outpatient psychiatrist earlier than her scheduled appointment if needs arise.   MOB expressed appreciation for the visit and support.   CSW Plan/Description:  1. Patient education: perinatal mood and anxiety 2.  No Further Intervention Required/No Barriers to Discharge    Sheilah Mins, LCSW 12/04/2015, 1:10 PM

## 2015-12-04 NOTE — Progress Notes (Signed)
While doing patient's discharge education and papers, she stated that the midwife Janett Billow and her had discussed increasing her dose of Abilify for postpartum.  Midwife on call, Jocelyn Lamer, called to inform since there is not prescription in for her.  Jocelyn Lamer stated she was coming to talk with the patient about it.  Will continue to monitor.  Galena Cellar RN

## 2015-12-05 NOTE — Progress Notes (Signed)
Post discharge chart review completed.  

## 2016-02-21 ENCOUNTER — Encounter (HOSPITAL_COMMUNITY): Payer: Self-pay | Admitting: *Deleted

## 2016-02-21 ENCOUNTER — Other Ambulatory Visit: Payer: Self-pay | Admitting: Obstetrics and Gynecology

## 2016-02-22 ENCOUNTER — Other Ambulatory Visit (HOSPITAL_COMMUNITY): Payer: Self-pay | Admitting: Obstetrics and Gynecology

## 2016-02-22 NOTE — H&P (Signed)
Shannon Duncan is a 35 y.o. female, P: 1-1-2-1-2  presents for bilateral salpingectomy because of a desire to end childbearing potential.  She is aware of other contraception options but has chosen permanent sterilization.   Past Medical History  OB History: G: 5;   P: 1-1-2-1-2;  SVB: 2008  and  2017   GYN History: menarche:  35 YO    Contracepton no method  The patient reports a past history of: chlamydia, gonorrhea, HPV and trichomonas.   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 albuterol (PROVENTIL HFA;VENTOLIN HFA) 108 (90 Base) MCG/ACT inhaler Inhale 1-2 puffs into the lungs every 6 (six) hours as needed for wheezing or shortness of breath.  Marland Kitchen ibuprofen (ADVIL,MOTRIN) 600 MG tablet Take 1 tablet (600 mg total) by mouth every 6 (six) hours as needed.  Marland Kitchen LORazepam (ATIVAN) 1 MG tablet Take 1 mg by mouth 4 (four) times daily. Patient says she can take 1mg  up to 4 times a day  . Prenatal Vit-Fe Fumarate-FA (PRENATAL MULTIVITAMIN) TABS tablet Take 1 tablet by mouth daily at 12 noon.  . sertraline (ZOLOFT) 25 MG tablet Take 1 tablet (25 mg total) by mouth daily.  . Sertraline HCl (ZOLOFT PO) Take 20 mg by mouth daily. Patient says she takes 2 of the 10 mg zoloft and gets it from dc office.   No facility-administered encounter medications on file as of 02/22/2016.    No Known Allergies  ROS:  Admits to  corrective lenses but denies headache, vision changes, nasal congestion, dysphagia, tinnitus, dizziness, hoarseness, cough,  chest pain, shortness of breath, nausea, vomiting, diarrhea,constipation,  urinary frequency, urgency  dysuria, hematuria, vaginitis symptoms, pelvic pain, swelling of joints,easy bruising,  myalgias, arthralgias, skin rashes, unexplained weight loss and except as is mentioned in the history of present illness, patient's review of systems is otherwise negative.    Physical Exam  Bp:  118/60  Weight: 129 lbs.  Height: 5'9"   BMI: 19  Neck: supple without masses or thyromegaly Lungs: clear to auscultation Heart: regular rate and rhythm Abdomen: soft, non-tender and no organomegaly Pelvic:EGBUS- wnl; vagina-normal rugae; uterus-normal size, cervix without lesions or motion tenderness; adnexae-no tenderness or masses Extremities:  no clubbing, cyanosis or edema   Assesment:  Desire for Sterilization   Disposition:  A discussion was held with patient regarding the indication for her procedure(s) along with the risks, which include but are not limited to: reaction to anesthesia, damage to adjacent organs, infection and excessive bleeding.  The patient verbalized understanding of these risks and has consented to proceed with Laparoscopic Bilateral Salpingectomy at [facility] on Feb 28, 2016.   CSN# [id number]   Jamina Macbeth J. Florene Glen, PA-C  for Dr. Franklyn Lor. Dillard

## 2016-02-28 ENCOUNTER — Encounter (HOSPITAL_COMMUNITY)
Admission: RE | Disposition: A | Discharge status: Home / Self Care | Payer: Self-pay | Source: Ambulatory Visit | Attending: Obstetrics and Gynecology

## 2016-02-28 ENCOUNTER — Ambulatory Visit (HOSPITAL_COMMUNITY): Payer: Medicaid Other | Admitting: Anesthesiology

## 2016-02-28 ENCOUNTER — Ambulatory Visit (HOSPITAL_COMMUNITY)
Admission: RE | Admit: 2016-02-28 | Discharge: 2016-02-28 | Disposition: A | Discharge status: Home / Self Care | Payer: Medicaid Other | Source: Ambulatory Visit | Attending: Obstetrics and Gynecology | Admitting: Obstetrics and Gynecology

## 2016-02-28 ENCOUNTER — Encounter (HOSPITAL_COMMUNITY): Payer: Self-pay | Admitting: Anesthesiology

## 2016-02-28 DIAGNOSIS — Z87891 Personal history of nicotine dependence: Secondary | ICD-10-CM | POA: Diagnosis not present

## 2016-02-28 DIAGNOSIS — I10 Essential (primary) hypertension: Secondary | ICD-10-CM | POA: Diagnosis not present

## 2016-02-28 DIAGNOSIS — J45909 Unspecified asthma, uncomplicated: Secondary | ICD-10-CM | POA: Diagnosis not present

## 2016-02-28 DIAGNOSIS — Z302 Encounter for sterilization: Secondary | ICD-10-CM | POA: Diagnosis not present

## 2016-02-28 HISTORY — PX: BILATERAL SALPINGECTOMY: SHX5743

## 2016-02-28 HISTORY — DX: Essential (primary) hypertension: I10

## 2016-02-28 LAB — CBC
HEMATOCRIT: 31.8 % — AB (ref 36.0–46.0)
HEMOGLOBIN: 11.3 g/dL — AB (ref 12.0–15.0)
MCH: 28.8 pg (ref 26.0–34.0)
MCHC: 35.5 g/dL (ref 30.0–36.0)
MCV: 80.9 fL (ref 78.0–100.0)
Platelets: 188 10*3/uL (ref 150–400)
RBC: 3.93 MIL/uL (ref 3.87–5.11)
RDW: 11.6 % (ref 11.5–15.5)
WBC: 4.3 10*3/uL (ref 4.0–10.5)

## 2016-02-28 LAB — PREGNANCY, URINE: Preg Test, Ur: NEGATIVE

## 2016-02-28 SURGERY — SALPINGECTOMY, BILATERAL, OPEN
Anesthesia: General | Site: Abdomen | Laterality: Bilateral

## 2016-02-28 MED ORDER — PROPOFOL 10 MG/ML IV BOLUS
INTRAVENOUS | Status: DC | PRN
  Administered 2016-02-28: 180 mg via INTRAVENOUS

## 2016-02-28 MED ORDER — DEXAMETHASONE SODIUM PHOSPHATE 10 MG/ML IJ SOLN
INTRAMUSCULAR | Status: DC | PRN
  Administered 2016-02-28: 4 mg via INTRAVENOUS

## 2016-02-28 MED ORDER — PROPOFOL 10 MG/ML IV BOLUS
INTRAVENOUS | Status: AC
  Filled 2016-02-28: qty 20

## 2016-02-28 MED ORDER — GLYCOPYRROLATE 0.2 MG/ML IJ SOLN
INTRAMUSCULAR | Status: DC | PRN
  Administered 2016-02-28: 0.2 mg via INTRAVENOUS

## 2016-02-28 MED ORDER — DEXAMETHASONE SODIUM PHOSPHATE 4 MG/ML IJ SOLN
INTRAMUSCULAR | Status: AC
  Filled 2016-02-28: qty 1

## 2016-02-28 MED ORDER — SUGAMMADEX SODIUM 200 MG/2ML IV SOLN
INTRAVENOUS | Status: AC
  Filled 2016-02-28: qty 2

## 2016-02-28 MED ORDER — LIDOCAINE HCL (PF) 1 % IJ SOLN
INTRAMUSCULAR | Status: AC
  Filled 2016-02-28: qty 5

## 2016-02-28 MED ORDER — BUPIVACAINE HCL (PF) 0.25 % IJ SOLN
INTRAMUSCULAR | Status: AC
  Filled 2016-02-28: qty 30

## 2016-02-28 MED ORDER — KETOROLAC TROMETHAMINE 30 MG/ML IJ SOLN
INTRAMUSCULAR | Status: AC
  Filled 2016-02-28: qty 1

## 2016-02-28 MED ORDER — MIDAZOLAM HCL 5 MG/5ML IJ SOLN
INTRAMUSCULAR | Status: DC | PRN
  Administered 2016-02-28: 2 mg via INTRAVENOUS

## 2016-02-28 MED ORDER — MIDAZOLAM HCL 2 MG/2ML IJ SOLN
INTRAMUSCULAR | Status: AC
  Filled 2016-02-28: qty 2

## 2016-02-28 MED ORDER — SCOPOLAMINE 1 MG/3DAYS TD PT72
MEDICATED_PATCH | TRANSDERMAL | Status: AC
  Filled 2016-02-28: qty 1

## 2016-02-28 MED ORDER — HYDROMORPHONE HCL 1 MG/ML IJ SOLN: 0.2500 mg | INTRAMUSCULAR | Status: DC | PRN

## 2016-02-28 MED ORDER — KETOROLAC TROMETHAMINE 30 MG/ML IJ SOLN
INTRAMUSCULAR | Status: DC | PRN
Start: 2016-02-28 — End: 2016-02-28
  Administered 2016-02-28: 30 mg via INTRAVENOUS

## 2016-02-28 MED ORDER — ONDANSETRON HCL 4 MG/2ML IJ SOLN
INTRAMUSCULAR | Status: DC | PRN
  Administered 2016-02-28: 4 mg via INTRAVENOUS

## 2016-02-28 MED ORDER — HYDROCODONE-ACETAMINOPHEN 7.5-325 MG PO TABS
ORAL_TABLET | ORAL | Status: AC
  Filled 2016-02-28: qty 1

## 2016-02-28 MED ORDER — BUPIVACAINE HCL (PF) 0.25 % IJ SOLN
INTRAMUSCULAR | Status: DC | PRN
  Administered 2016-02-28: 7 mL

## 2016-02-28 MED ORDER — MEPERIDINE HCL 25 MG/ML IJ SOLN: 6.2500 mg | INTRAMUSCULAR | Status: DC | PRN

## 2016-02-28 MED ORDER — HYDROCODONE-ACETAMINOPHEN 5-325 MG PO TABS: 1.0000 | ORAL_TABLET | Freq: Four times a day (QID) | ORAL | Status: AC | PRN

## 2016-02-28 MED ORDER — IBUPROFEN 600 MG PO TABS: 600.0000 mg | ORAL_TABLET | Freq: Four times a day (QID) | ORAL | Status: AC | PRN

## 2016-02-28 MED ORDER — SCOPOLAMINE 1 MG/3DAYS TD PT72: 1.0000 | MEDICATED_PATCH | Freq: Once | TRANSDERMAL | Status: DC

## 2016-02-28 MED ORDER — SUGAMMADEX SODIUM 200 MG/2ML IV SOLN
INTRAVENOUS | Status: DC | PRN
  Administered 2016-02-28: 119.8 mg via INTRAVENOUS

## 2016-02-28 MED ORDER — ROCURONIUM BROMIDE 100 MG/10ML IV SOLN
INTRAVENOUS | Status: DC | PRN
  Administered 2016-02-28: 30 mg via INTRAVENOUS
  Administered 2016-02-28: 10 mg via INTRAVENOUS

## 2016-02-28 MED ORDER — HYDROCODONE-ACETAMINOPHEN 7.5-325 MG PO TABS
1.0000 | ORAL_TABLET | Freq: Once | ORAL | Status: AC | PRN
  Administered 2016-02-28: 1 via ORAL

## 2016-02-28 MED ORDER — METOCLOPRAMIDE HCL 5 MG/ML IJ SOLN
INTRAMUSCULAR | Status: AC
  Filled 2016-02-28: qty 2

## 2016-02-28 MED ORDER — ONDANSETRON HCL 4 MG/2ML IJ SOLN
INTRAMUSCULAR | Status: AC
  Filled 2016-02-28: qty 2

## 2016-02-28 MED ORDER — LACTATED RINGERS IV SOLN
INTRAVENOUS | Status: DC
  Administered 2016-02-28: 13:00:00 via INTRAVENOUS
  Administered 2016-02-28: 125 mL/h via INTRAVENOUS

## 2016-02-28 MED ORDER — LIDOCAINE HCL (CARDIAC) 20 MG/ML IV SOLN
INTRAVENOUS | Status: DC | PRN
  Administered 2016-02-28: 50 mg via INTRAVENOUS

## 2016-02-28 MED ORDER — METOCLOPRAMIDE HCL 5 MG/ML IJ SOLN: 10.0000 mg | Freq: Once | INTRAMUSCULAR | Status: DC | PRN

## 2016-02-28 MED ORDER — FENTANYL CITRATE (PF) 250 MCG/5ML IJ SOLN
INTRAMUSCULAR | Status: AC
  Filled 2016-02-28: qty 5

## 2016-02-28 MED ORDER — FENTANYL CITRATE (PF) 100 MCG/2ML IJ SOLN
INTRAMUSCULAR | Status: DC | PRN
  Administered 2016-02-28 (×4): 25 ug via INTRAVENOUS
  Administered 2016-02-28 (×3): 50 ug via INTRAVENOUS

## 2016-02-28 SURGICAL SUPPLY — 19 items
CATH ROBINSON RED A/P 16FR (CATHETERS) ×3 IMPLANT
CLOTH BEACON ORANGE TIMEOUT ST (SAFETY) ×3 IMPLANT
DRSG COVADERM PLUS 2X2 (GAUZE/BANDAGES/DRESSINGS) ×6 IMPLANT
DRSG OPSITE POSTOP 3X4 (GAUZE/BANDAGES/DRESSINGS) ×6 IMPLANT
FORCEPS CUTTING 45CM 5MM (CUTTING FORCEPS) ×3 IMPLANT
GLOVE BIO SURGEON STRL SZ 6.5 (GLOVE) ×3 IMPLANT
GLOVE BIOGEL PI IND STRL 7.0 (GLOVE) ×4 IMPLANT
GLOVE BIOGEL PI INDICATOR 7.0 (GLOVE) ×2
GOWN STRL REUS W/TWL LRG LVL3 (GOWN DISPOSABLE) ×6 IMPLANT
LIQUID BAND (GAUZE/BANDAGES/DRESSINGS) ×3 IMPLANT
PACK LAPAROSCOPY BASIN (CUSTOM PROCEDURE TRAY) ×3 IMPLANT
PAD TRENDELENBURG POSITION (MISCELLANEOUS) ×3 IMPLANT
SLEEVE XCEL OPT CAN 5 100 (ENDOMECHANICALS) ×3 IMPLANT
SUT MNCRL AB 3-0 PS2 27 (SUTURE) ×3 IMPLANT
SUT VICRYL 0 UR6 27IN ABS (SUTURE) ×3 IMPLANT
TOWEL OR 17X24 6PK STRL BLUE (TOWEL DISPOSABLE) ×6 IMPLANT
TROCAR BALLN 12MMX100 BLUNT (TROCAR) ×3 IMPLANT
TROCAR XCEL NON-BLD 5MMX100MML (ENDOMECHANICALS) ×3 IMPLANT
WATER STERILE IRR 1000ML POUR (IV SOLUTION) ×3 IMPLANT

## 2016-02-28 NOTE — Anesthesia Postprocedure Evaluation (Addendum)
Anesthesia Post Note  Patient: Shannon Duncan  Procedure(s) Performed: Procedure(s) (LRB): BILATERAL SALPINGECTOMY (Bilateral)  Patient location during evaluation: PACU Anesthesia Type: General Level of consciousness: awake and alert Pain management: pain level controlled Vital Signs Assessment: post-procedure vital signs reviewed and stable Respiratory status: spontaneous breathing, nonlabored ventilation and respiratory function stable Cardiovascular status: blood pressure returned to baseline and stable Postop Assessment: no signs of nausea or vomiting Anesthetic complications: no Comments: Patient noted to have loose lower right central incisor which she states was not loose prior to surgery.     Last Vitals:  Filed Vitals:   02/28/16 1439 02/28/16 1445  BP:  115/73  Pulse: 60 48  Temp: 37.1 C   Resp: 16 16    Last Pain: There were no vitals filed for this visit. Pain Goal: Patients Stated Pain Goal: 4 (02/28/16 1057)               Lavern Maslow A.

## 2016-02-28 NOTE — Op Note (Signed)
dications: Shannon Duncan is a 35 y.o. female with diagnosis of multiparity.  Pre-operative Diagnosis: Multiparity desires sterilization  Post-operative Diagnosis: same  Surgeon: [id number]   Assistants: Earnstine Regal PA  Anesthesia: General endotracheal anesthesia   Procedure : Laparoscopic Bilateral Salpingectomy  Procedure Details  The patient was seen in the Holding Room. The risks, benefits, complications, treatment options, and expected outcomes were discussed with the patient. The possibilities of reaction to medication, pulmonary aspiration, perforation of viscus, bleeding, recurrent infection, the need for additional procedures, failure to diagnose a condition, and creating a complication requiring transfusion or operation were discussed with the patient. The patient concurred with the proposed plan, giving informed consent. The patient was taken to the Operating Room, identified as Shannon Duncan and the procedure verified as Diagnostic Laparoscopy with B sallpingecotmy. A Time Out was held and the above information confirmed.  After induction of general anesthesia, the patient was placed in modified dorsal lithotomy position where she was prepped, draped, and catheterized in the normal, sterile fashion. A foley catheter was placed..  The cervix was visualized and an intrauterine manipulator was placed. A 2 cm umbilical incision was then performed.and carried down to the fascia.  The fascia was then opened and extended bilaterally.  Peritoneum was then entered.  o vicryl was then placed around the fascia in a circumferential fashion.   The hasson was placed and ancored to the suture.. Normal pelvic anatomy was noted.   The tubes, ovaries and appendix apperared normal.   The anterior and  Posterior culdesac and liver appeared normal.     Two 73mm trocars were placed in the right and left lower quadrants of the abdomen under direct visualization of the laparoscope.  The tubes  were removed and sent to pathology using the gyrus.  Hemostasis was noted.  Air was allowed to leave the abdomen.  The abdomen was reinsufflated and hemostasis was still noted.     Following the procedure the umbilical hasson was removed after intra-abdominal carbon dioxide was expressed. The fascia was reaproximated by tying the 0 vicryl suture.   The 3mm skin incision was closed with dermabond.  The 10 mm incision was closed with a  subcuticular suture of 3-0 monocryl. The intrauterine manipulator was then removed.  The tenaculum site was oozing and made henmostatic with silver nitrate.   Instrument, sponge, and needle counts were correct prior to abdominal closure and at the conclusion of the case.  Findings: See above Estimated Blood Loss:  Minimal         Drains: none         Total IV Fluids:Intravenous fluids were administered, normal saline  m         Specimens: none              Complications:  None; patient tolerated the procedure well.         Disposition: PACU - hemodynamically stable.         Condition: stable

## 2016-02-28 NOTE — Transfer of Care (Signed)
Immediate Anesthesia Transfer of Care Note  Patient: Shannon Duncan  Procedure(s) Performed: Procedure(s): LAPAROSCOPIC TUBAL LIGATION (Bilateral)  Patient Location: PACU  Anesthesia Type:General  Level of Consciousness: awake and oriented  Airway & Oxygen Therapy: Patient Spontanous Breathing and Patient connected to nasal cannula oxygen  Post-op Assessment: Report given to RN and Post -op Vital signs reviewed and stable  Post vital signs: Reviewed and stable  Last Vitals:  Filed Vitals:   02/28/16 1057  BP: 108/64  Pulse: 52  Temp: 36.7 C  Resp: 16    Last Pain: There were no vitals filed for this visit.    Patients Stated Pain Goal: 4 ([id number])  Complications: No apparent anesthesia complications

## 2016-02-28 NOTE — Discharge Instructions (Signed)
Laparoscopic Tubal Ligation Laparoscopic tubal ligation is a procedure that closes the fallopian tubes at a time other than right after childbirth. When the fallopian tubes are closed, the eggs that are released from the ovaries cannot enter the uterus, and sperm cannot reach the egg. Tubal ligation is also known as getting your "tubes tied." Tubal ligation is done so you will not be able to get pregnant or have a baby. Although this procedure may be undone (reversed), it should be considered permanent and irreversible. If you want to have future pregnancies, you should not have this procedure. LET [facility] CARE PROVIDER KNOW ABOUT:  Any allergies you have.  All medicines you are taking, including vitamins, herbs, eye drops, creams, and over-the-counter medicines. This includes any use of steroids, either by mouth or in cream form.  Previous problems you or members of your family have had with the use of anesthetics.  Any blood disorders you have.  Previous surgeries you have had.  Any medical conditions you may have.  Possibility of pregnancy, if this applies.  Any past pregnancies. RISKS AND COMPLICATIONS  Infection.  Bleeding.  Injury to surrounding organs.  Side effects from anesthetics.  Failure of the procedure.  Ectopic pregnancy.  Future regret about having the procedure done. BEFORE THE PROCEDURE  Ask your health care provider about:  Changing or stopping your regular medicines. This is especially important if you are taking diabetes medicines or blood thinners.  Taking medicines such as aspirin and ibuprofen. These medicines can thin your blood. Do not take these medicines before your procedure if your health care provider instructs you not to.  Follow instructions from your health care provider about eating and drinking restrictions.  Plan to have someone take you home after the procedure.  If you go home right after the procedure, plan to have someone  with you for 24 hours. PROCEDURE  You will be given one or more of the following:  A medicine that helps you relax (sedative).  A medicine that numbs the area (local anesthetic).  A medicine that makes you fall asleep (general anesthetic).  A medicine that is injected into an area of your body that numbs everything below the injection site (regional anesthetic).  If you have been given general anesthetic, a tube will be put down your throat to help you breathe.  Two small cuts (incisions) will be made in the lower abdominal area and near the belly button.  Your bladder may be emptied with a small tube (catheter).  Your abdomen will be inflated with a safe gas (carbon dioxide). This will help to give the surgeon room to operate and visualize, and it will help the surgeon to avoid other organs.  A thin, lighted tube (laparoscope) with a camera attached will be inserted into your abdomen through one of the incisions near the belly button. Other small instruments will be inserted through the other abdominal incision.  The fallopian tubes will be tied off or burned (cauterized), or they will be blocked with a clip, ring, or clamp. In many cases, a small portion in the center of each fallopian tube will also be removed.  After the fallopian tubes are blocked, the gas will be released from the abdomen.  The incisions will be closed with stitches (sutures).  A bandage (dressing) will be placed over the incisions. The procedure may vary among health care providers and hospitals. AFTER THE PROCEDURE  Your blood pressure, heart rate, breathing rate, and blood oxygen level  will be monitored often until the medicines you were given have worn off.  You will be given pain medicine as needed.  If you had general anesthetic, you may have some mild discomfort in your throat. This is from the breathing tube that was placed in your throat while you were sleeping.  You may experience discomfort in  the shoulder area from some trapped air between your liver and your diaphragm. This sensation is normal, and it will slowly go away on its own.  You will have some mild abdominal discomfort for 3--7 days.   This information is not intended to replace advice given to you by your health care provider. Make sure you discuss any questions you have with your health care provider.   Document Released: 12/23/2000 Document Revised: 01/31/2015 Document Reviewed: 12/28/2011 Elsevier Interactive Patient Education [employer]. Both of your tubes were removedDISCHARGE INSTRUCTIONS: Laparoscopy  The following instructions have been prepared to help you care for yourself upon your return home today.  Wound care:  Do not get the incision wet for the first 24 hours. The incision should be kept clean and dry.  The Band-Aids or dressings may be removed the day after surgery.  Should the incision become sore, red, and swollen after the first week, check with your doctor.  Personal hygiene:  Shower the day after your procedure.  Activity and limitations:  Do NOT drive or operate any equipment today.  Do NOT lift anything more than 15 pounds for 2-3 weeks after surgery.  Do NOT rest in bed all day.  Walking is encouraged. Walk each day, starting slowly with 5-minute walks 3 or 4 times a day. Slowly increase the length of your walks.  Walk up and down stairs slowly.  Do NOT do strenuous activities, such as golfing, playing tennis, bowling, running, biking, weight lifting, gardening, mowing, or vacuuming for 2-4 weeks. Ask your doctor when it is okay to start.  Diet: Eat a light meal as desired this evening. You may resume your usual diet tomorrow.  Return to work: This is dependent on the type of work you do. For the most part you can return to a desk job within a week of surgery. If you are more active at work, please discuss this with your doctor.  What to expect after your surgery: You  may have a slight burning sensation when you urinate on the first day. You may have a very small amount of blood in the urine. Expect to have a small amount of vaginal discharge/light bleeding for 1-2 weeks. It is not unusual to have abdominal soreness and bruising for up to 2 weeks. You may be tired and need more rest for about 1 week. You may experience shoulder pain for 24-72 hours. Lying flat in bed may relieve it.  Call your doctor for any of the following:  Develop a fever of 100.4 or greater  Inability to urinate 6 hours after discharge from hospital  Severe pain not relieved by pain medications  Persistent of heavy bleeding at incision site  Redness or swelling around incision site after a week  Increasing nausea or vomiting  Patient Signature________________________________________ Nurse Signature_________________________________________

## 2016-02-28 NOTE — Anesthesia Preprocedure Evaluation (Addendum)
Anesthesia Evaluation  Patient identified by MRN, date of birth, ID band Patient awake    Reviewed: Allergy & Precautions, NPO status , Patient's Chart, lab work & pertinent test results  Airway Mallampati: II  TM Distance: >3 FB Neck ROM: Full    Dental no notable dental hx. (+) Teeth Intact   Pulmonary asthma , former smoker,    Pulmonary exam normal breath sounds clear to auscultation       Cardiovascular hypertension, negative cardio ROS Normal cardiovascular exam Rhythm:Regular Rate:Bradycardia     Neuro/Psych PSYCHIATRIC DISORDERS Anxiety Depression Bipolar Disorder Hx/o Panic attacks   GI/Hepatic negative GI ROS, Neg liver ROS,   Endo/Other  negative endocrine ROS  Renal/GU negative Renal ROS     Musculoskeletal negative musculoskeletal ROS (+)   Abdominal   Peds  Hematology  (+) Sickle cell trait and anemia ,   Anesthesia Other Findings   Reproductive/Obstetrics                            Anesthesia Physical Anesthesia Plan  ASA: II  Anesthesia Plan: General   Post-op Pain Management:    Induction: Intravenous  Airway Management Planned: Oral ETT  Additional Equipment:   Intra-op Plan:   Post-operative Plan: Extubation in OR  Informed Consent: I have reviewed the patients History and Physical, chart, labs and discussed the procedure including the risks, benefits and alternatives for the proposed anesthesia with the patient or authorized representative who has indicated his/her understanding and acceptance.   Dental advisory given  Plan Discussed with: CRNA, Anesthesiologist and Surgeon  Anesthesia Plan Comments:         Anesthesia Quick Evaluation

## 2016-02-28 NOTE — Anesthesia Procedure Notes (Signed)
Procedure Name: Intubation Date/Time: 02/28/2016 12:22 PM Performed by: Bufford Spikes Pre-anesthesia Checklist: Patient identified, Timeout performed, Emergency Drugs available, Suction available and Patient being monitored Patient Re-evaluated:Patient Re-evaluated prior to inductionOxygen Delivery Method: Circle system utilized Preoxygenation: Pre-oxygenation with 100% oxygen Intubation Type: IV induction Ventilation: Mask ventilation without difficulty Laryngoscope Size: Miller and 2 Grade View: Grade I Tube type: Oral Number of attempts: 1 Airway Equipment and Method: Stylet Placement Confirmation: ETT inserted through vocal cords under direct vision,  positive ETCO2 and breath sounds checked- equal and bilateral Secured at: 20 cm Tube secured with: Tape Dental Injury: Teeth and Oropharynx as per pre-operative assessment

## 2016-02-28 NOTE — Addendum Note (Signed)
Addendum  created 02/28/16 1534 by Josephine Igo, MD   Modules edited: Clinical Notes   Clinical Notes:  File: [id number]

## 2016-02-29 ENCOUNTER — Encounter (HOSPITAL_COMMUNITY): Payer: Self-pay | Admitting: Obstetrics and Gynecology

## 2016-02-29 NOTE — Progress Notes (Signed)
Spoke with pt for post op follow up call. States she is doing well, other than her sore throat and she has a "tooth that is loose" and a sore on her lip that she states she did not have prior to surgery. Pt states she told the anesthesiologist yesterday before leaving.  Advised I would make note of our discussion, and pass information along to [facility]. Pt to call here or doctors office with any further concerns.  Nicki Reaper RN

## 2018-09-01 DIAGNOSIS — K5900 Constipation, unspecified: Secondary | ICD-10-CM | POA: Diagnosis not present

## 2018-09-01 DIAGNOSIS — R634 Abnormal weight loss: Secondary | ICD-10-CM | POA: Diagnosis not present

## 2018-09-01 DIAGNOSIS — R591 Generalized enlarged lymph nodes: Secondary | ICD-10-CM | POA: Diagnosis not present

## 2018-09-01 DIAGNOSIS — Z1321 Encounter for screening for nutritional disorder: Secondary | ICD-10-CM | POA: Diagnosis not present

## 2018-12-20 ENCOUNTER — Emergency Department (HOSPITAL_COMMUNITY)
Admission: EM | Admit: 2018-12-20 | Discharge: 2018-12-20 | Disposition: A | Discharge status: Home / Self Care | Payer: Self-pay | Attending: Emergency Medicine | Admitting: Emergency Medicine

## 2018-12-20 ENCOUNTER — Encounter (HOSPITAL_COMMUNITY): Payer: Self-pay | Admitting: *Deleted

## 2018-12-20 ENCOUNTER — Other Ambulatory Visit: Payer: Self-pay

## 2018-12-20 DIAGNOSIS — Z87891 Personal history of nicotine dependence: Secondary | ICD-10-CM | POA: Insufficient documentation

## 2018-12-20 DIAGNOSIS — I10 Essential (primary) hypertension: Secondary | ICD-10-CM | POA: Insufficient documentation

## 2018-12-20 DIAGNOSIS — Z79899 Other long term (current) drug therapy: Secondary | ICD-10-CM | POA: Insufficient documentation

## 2018-12-20 DIAGNOSIS — K921 Melena: Secondary | ICD-10-CM

## 2018-12-20 DIAGNOSIS — K625 Hemorrhage of anus and rectum: Secondary | ICD-10-CM | POA: Insufficient documentation

## 2018-12-20 DIAGNOSIS — J45909 Unspecified asthma, uncomplicated: Secondary | ICD-10-CM | POA: Insufficient documentation

## 2018-12-20 LAB — URINALYSIS, ROUTINE W REFLEX MICROSCOPIC
BILIRUBIN URINE: NEGATIVE
Glucose, UA: NEGATIVE mg/dL
HGB URINE DIPSTICK: NEGATIVE
Ketones, ur: NEGATIVE mg/dL
Leukocytes,Ua: NEGATIVE
NITRITE: NEGATIVE
PH: 7 (ref 5.0–8.0)
Protein, ur: NEGATIVE mg/dL
SPECIFIC GRAVITY, URINE: 1.01 (ref 1.005–1.030)

## 2018-12-20 LAB — COMPREHENSIVE METABOLIC PANEL
ALT: 7 U/L (ref 0–44)
AST: 14 U/L — AB (ref 15–41)
Albumin: 3.8 g/dL (ref 3.5–5.0)
Alkaline Phosphatase: 88 U/L (ref 38–126)
Anion gap: 3 — ABNORMAL LOW (ref 5–15)
BUN: 10 mg/dL (ref 6–20)
CHLORIDE: 108 mmol/L (ref 98–111)
CO2: 26 mmol/L (ref 22–32)
CREATININE: 0.83 mg/dL (ref 0.44–1.00)
Calcium: 9.2 mg/dL (ref 8.9–10.3)
GFR calc Af Amer: >60 mL/min (ref >60)
Glucose, Bld: 84 mg/dL (ref 70–99)
Potassium: 3.6 mmol/L (ref 3.5–5.1)
Sodium: 137 mmol/L (ref 135–145)
Total Bilirubin: 0.7 mg/dL (ref 0.3–1.2)
Total Protein: 6.9 g/dL (ref 6.5–8.1)

## 2018-12-20 LAB — CBC WITH DIFFERENTIAL/PLATELET
ABS IMMATURE GRANULOCYTES: 0.01 10*3/uL (ref 0.00–0.07)
Basophils Absolute: 0.1 10*3/uL (ref 0.0–0.1)
Basophils Relative: 1 %
Eosinophils Absolute: 0.1 10*3/uL (ref 0.0–0.5)
Eosinophils Relative: 1 %
HCT: 35 % — ABNORMAL LOW (ref 36.0–46.0)
HEMOGLOBIN: 11.6 g/dL — AB (ref 12.0–15.0)
Immature Granulocytes: 0 %
LYMPHS ABS: 1.5 10*3/uL (ref 0.7–4.0)
LYMPHS PCT: 29 %
MCH: 27.7 pg (ref 26.0–34.0)
MCHC: 33.1 g/dL (ref 30.0–36.0)
MCV: 83.5 fL (ref 80.0–100.0)
MONOS PCT: 10 %
Monocytes Absolute: 0.5 10*3/uL (ref 0.1–1.0)
NEUTROS ABS: 3 10*3/uL (ref 1.7–7.7)
Neutrophils Relative %: 59 %
Platelets: 219 10*3/uL (ref 150–400)
RBC: 4.19 MIL/uL (ref 3.87–5.11)
RDW: 10.9 % — ABNORMAL LOW (ref 11.5–15.5)
WBC: 5.1 10*3/uL (ref 4.0–10.5)
nRBC: 0 % (ref 0.0–0.2)

## 2018-12-20 LAB — LIPASE, BLOOD: Lipase: 24 U/L (ref 11–51)

## 2018-12-20 LAB — PREGNANCY, URINE: PREG TEST UR: NEGATIVE

## 2018-12-20 LAB — POC OCCULT BLOOD, ED: FECAL OCCULT BLD: POSITIVE — AB

## 2018-12-20 MED ORDER — POLYETHYLENE GLYCOL 3350 17 G PO PACK: 17.0000 g | PACK | Freq: Every day | ORAL | 0 refills | Status: AC

## 2018-12-20 NOTE — ED Provider Notes (Signed)
15:30: Assumed care from [place] PA-C at change of shift pending labs. Plan for discharge home with Miralax & GI follow up pending no significant lab abnormalities/concerning findings.   Please see prior provider note for full H&P  Patient is a 38 year old female who presented to the ER w/ one isolated episode of bright red blood on toilet paper today during which she was straining to have a bowel movement & had some rectal pain. She has had some lower abdominal pain & constipation for several months, no acute change today, still having bowel movements and passing gas. No fever, nausea, or vomiting. She recently had a family member diagnosed w/ colon cancer which she is concerned about.    Physical Exam  BP (!) 121/50 (BP Location: Right Arm)   Pulse 65   Temp 98.3 F (36.8 C) (Oral)   Resp 16   Ht 5\' 7"  (1.702 m)   SpO2 100%   BMI 20.67 kg/m   Physical Exam Vitals signs and nursing note reviewed. Exam conducted with a chaperone present.  Constitutional:      General: She is not in acute distress.    Appearance: She is well-developed.  HENT:     Head: Normocephalic and atraumatic.  Eyes:     General:        Right eye: No discharge.        Left eye: No discharge.     Conjunctiva/sclera: Conjunctivae normal.  Abdominal:     General: There is no distension.     Palpations: Abdomen is soft.     Tenderness: There is no abdominal tenderness. There is no guarding or rebound.  Genitourinary:    Comments: Patient had a very small skin tear noted posteriorly on rectal exam. No bleeding, no surrounding erythema. No melena. No bright red blood. Mildly tender to area. No fluctuance/induration.  Neurological:     Mental Status: She is alert.     Comments: Clear speech.   Psychiatric:        Behavior: Behavior normal.        Thought Content: Thought content normal.     ED Course/Procedures     Results for orders placed or performed during the hospital encounter of 12/20/18   CBC with Differential  Result Value Ref Range   WBC 5.1 4.0 - 10.5 K/uL   RBC 4.19 3.87 - 5.11 MIL/uL   Hemoglobin 11.6 (L) 12.0 - 15.0 g/dL   HCT 35.0 (L) 36.0 - 46.0 %   MCV 83.5 80.0 - 100.0 fL   MCH 27.7 26.0 - 34.0 pg   MCHC 33.1 30.0 - 36.0 g/dL   RDW 10.9 (L) 11.5 - 15.5 %   Platelets 219 150 - 400 K/uL   nRBC 0.0 0.0 - 0.2 %   Neutrophils Relative % 59 %   Neutro Abs 3.0 1.7 - 7.7 K/uL   Lymphocytes Relative 29 %   Lymphs Abs 1.5 0.7 - 4.0 K/uL   Monocytes Relative 10 %   Monocytes Absolute 0.5 0.1 - 1.0 K/uL   Eosinophils Relative 1 %   Eosinophils Absolute 0.1 0.0 - 0.5 K/uL   Basophils Relative 1 %   Basophils Absolute 0.1 0.0 - 0.1 K/uL   Immature Granulocytes 0 %   Abs Immature Granulocytes 0.01 0.00 - 0.07 K/uL  Comprehensive metabolic panel  Result Value Ref Range   Sodium 137 135 - 145 mmol/L   Potassium 3.6 3.5 - 5.1 mmol/L   Chloride 108 98 -  111 mmol/L   CO2 26 22 - 32 mmol/L   Glucose, Bld 84 70 - 99 mg/dL   BUN 10 6 - 20 mg/dL   Creatinine, Ser 0.83 0.44 - 1.00 mg/dL   Calcium 9.2 8.9 - 10.3 mg/dL   Total Protein 6.9 6.5 - 8.1 g/dL   Albumin 3.8 3.5 - 5.0 g/dL   AST 14 (L) 15 - 41 U/L   ALT 7 0 - 44 U/L   Alkaline Phosphatase 88 38 - 126 U/L   Total Bilirubin 0.7 0.3 - 1.2 mg/dL   GFR calc non Af Amer >60 >60 mL/min   GFR calc Af Amer >60 >60 mL/min   Anion gap 3 (L) 5 - 15  Urinalysis, Routine w reflex microscopic  Result Value Ref Range   Color, Urine STRAW (A) YELLOW   APPearance CLEAR CLEAR   Specific Gravity, Urine 1.010 1.005 - 1.030   pH 7.0 5.0 - 8.0   Glucose, UA NEGATIVE NEGATIVE mg/dL   Hgb urine dipstick NEGATIVE NEGATIVE   Bilirubin Urine NEGATIVE NEGATIVE   Ketones, ur NEGATIVE NEGATIVE mg/dL   Protein, ur NEGATIVE NEGATIVE mg/dL   Nitrite NEGATIVE NEGATIVE   Leukocytes,Ua NEGATIVE NEGATIVE  Lipase, blood  Result Value Ref Range   Lipase 24 11 - 51 U/L  Pregnancy, urine  Result Value Ref Range   Preg Test, Ur NEGATIVE  NEGATIVE  POC occult blood, ED Provider will collect  Result Value Ref Range   Fecal Occult Bld POSITIVE (A) NEGATIVE   No results found.   Procedures  MDM   Work-up reviewed:  CBC: Hgb/hct stable & consistent w/ prior labs. No leukocytosis.  CMP: Unremarkable  Lipase: WNL UA: No UTI Preg test: Negative POC occult blood: negative.   Personally examined patient. No abdominal tenderness/peritoneal signs- no acute surgical abdomen. Passing gas & remains with ability to have a bowel movement, doubt obstruction/perf. Sxs seem consistent w/ constipation w small skin tear, not definitive cause of bleed, but possibility.Hgn/hct stable. BUN normal, with H&P does not seem like significant upper GI bleed.  Tx w/ miralax w/ GI follow up. I discussed results, treatment plan, need for follow-up, and return precautions with the patient. Provided opportunity for questions, patient confirmed understanding and is in agreement with plan.      Amaryllis Dyke, PA-C 12/20/18 1658    Pattricia Boss, MD 12/26/18 [phone]

## 2018-12-20 NOTE — Discharge Instructions (Addendum)
You were seen in the emergency department today for blood in your stool.  Your labs were reassuring.  Your stool sample did show some blood in it.  Your symptoms in general may be related to constipation, please take MiraLAX daily as needed for constipation/abdominal pain.  We have prescribed you new medication(s) today. Discuss the medications prescribed today with your pharmacist as they can have adverse effects and interactions with your other medicines including over the counter and prescribed medications. Seek medical evaluation if you start to experience new or abnormal symptoms after taking one of these medicines, seek care immediately if you start to experience difficulty breathing, feeling of your throat closing, facial swelling, or rash as these could be indications of a more serious allergic reaction  We would like you to follow-up closely with a GI doctor within the next 3 days.  Return to the ER for new or worsening symptoms including but not limited to fever, increased pain, inability to have a bowel movement, and inability to pass gas, repetitive bloody bowel movements, or any other concerns.

## 2018-12-20 NOTE — ED Provider Notes (Signed)
[facility] EMERGENCY DEPARTMENT Provider Note   CSN: [id number] Arrival date & time: 12/20/18  1438    History   Chief Complaint Chief Complaint  Patient presents with  . Rectal Bleeding    HPI Shannon Duncan is a 38 y.o. female with a history of asthma, bipolar 1 disorder, hypertension, constipation, borderline anemia presenting to emergency department today with chief complaint of blood in stool x1 day.    Patient states just prior to arrival she was having a bowel movement and noticed bright red blood on the toilet paper. She also saw looked to be a blood clot on the toilet paper. Pt states she has had intermittent constipation x 1 year. Today she was straining to have a bowel movement and she describes her stool as "small hard pieces." Pt did take an all natural laxative x 4 days ago.  Pt has associated abdominal pain. It is located at her umbilicus and radiates to her right and left lower quadrants. She describes the pain as a cramping that is intermittent over the last several months. She rates the pain 6 out of in 10 in severity. She has not taken any medications for her abdominal pain prior to arrival. She denies any rectal pain, fever, chills, nausea, vomiting, weight loss. Pt reports her aunt was recently diagnosed with stage IV colon cancer. Pt's  abdominal surgical history includes bilateral salpingectomy.  History provided by pt.  Past Medical History:  Diagnosis Date  . Asthma   . Bipolar 1 disorder ([place])   . Hypertension    PREGNANCY RELATED 9 YEARS AGO  . Panic attack     Patient Active Problem List   Diagnosis Date Noted  . Labor and delivery, indication for care 12/03/2015  . SVD (spontaneous vaginal delivery) 12/03/2015  . Depression 12/03/2015  . AS (sickle cell trait) ([place]) 12/03/2015  . Braxton Hick's contraction 11/08/2015  . [redacted] weeks gestation of pregnancy   . Fetal cystic hygroma   . Hydrops fetalis in first trimester,  antepartum     Past Surgical History:  Procedure Laterality Date  . BILATERAL SALPINGECTOMY Bilateral 02/28/2016   Procedure: BILATERAL SALPINGECTOMY;  Surgeon: Crawford Givens, MD;  Location: [facility];  Service: Gynecology;  Laterality: Bilateral;  . DILATION AND EVACUATION N/A 08/24/2014   Procedure: DILATATION AND EVACUATION (D&E) 2ND TRIMESTER;  Surgeon: Marvene Staff, MD;  Location: [facility];  Service: Gynecology;  Laterality: N/A;  1 hr.  . OPERATIVE ULTRASOUND N/A 08/24/2014   Procedure: OPERATIVE ULTRASOUND;  Surgeon: Marvene Staff, MD;  Location: [facility];  Service: Gynecology;  Laterality: N/A;     OB History    Gravida  5   Para  2   Term  1   Preterm  1   AB  3   Living  2     SAB  1   TAB  2   Ectopic      Multiple  0   Live Births  2            Home Medications    Prior to Admission medications   Medication Sig Start Date End Date Taking? Authorizing Provider  albuterol (PROVENTIL HFA;VENTOLIN HFA) 108 (90 Base) MCG/ACT inhaler Inhale 1-2 puffs into the lungs every 6 (six) hours as needed for wheezing or shortness of breath.    [provider]  HYDROcodone-acetaminophen (NORCO/VICODIN) 5-325 MG tablet Take 1 tablet by mouth every 6 (six) hours as needed. 02/28/16  Crawford Givens, MD  ibuprofen (ADVIL,MOTRIN) 600 MG tablet Take 1 tablet (600 mg total) by mouth every 6 (six) hours as needed. 02/28/16   Dillard, Gwynneth Munson, MD  LORazepam (ATIVAN) 1 MG tablet Take 1 mg by mouth 4 (four) times daily. Patient says she can take 1mg  up to 4 times a day    [provider]  Prenatal Vit-Fe Fumarate-FA (PRENATAL MULTIVITAMIN) TABS tablet Take 1 tablet by mouth daily at 12 noon.    [provider]  sertraline (ZOLOFT) 25 MG tablet Take 1 tablet (25 mg total) by mouth daily. 12/04/15   Donnel Saxon, CNM    Family History History reviewed. No pertinent family history.  Social History Social History   Tobacco Use  . Smoking status:  Former Smoker    Types: Cigarettes  . Smokeless tobacco: Never Used  Substance Use Topics  . Alcohol use: Yes    Alcohol/week: 1.0 standard drinks    Types: 1 Glasses of wine per week    Comment: OCCASIONALLY  . Drug use: No    Types: Marijuana    Comment: have not used  since pregnancy     Allergies   Patient has no known allergies.   Review of Systems Review of Systems  Constitutional: Negative for chills and fever.  HENT: Negative for congestion, ear discharge, ear pain, sinus pressure, sinus pain and sore throat.   Eyes: Negative for pain and redness.  Respiratory: Negative for cough and shortness of breath.   Cardiovascular: Negative for chest pain.  Gastrointestinal: Positive for abdominal pain, blood in stool and constipation. Negative for diarrhea, nausea, rectal pain and vomiting.  Genitourinary: Negative for dysuria and hematuria.  Musculoskeletal: Negative for back pain and neck pain.  Skin: Negative for wound.  Neurological: Negative for weakness, numbness and headaches.     Physical Exam Updated Vital Signs BP (!) 121/50 (BP Location: Right Arm)   Pulse 65   Temp 98.3 F (36.8 C) (Oral)   Resp 16   Ht 5\' 7"  (1.702 m)   SpO2 100%   BMI 20.67 kg/m   Physical Exam Vitals signs and nursing note reviewed.  Constitutional:      Appearance: She is well-developed. She is not ill-appearing or toxic-appearing.  HENT:     Head: Normocephalic and atraumatic.     Nose: Nose normal.     Mouth/Throat:     Mouth: Mucous membranes are moist.     Pharynx: Oropharynx is clear.  Eyes:     General: No scleral icterus.       Right eye: No discharge.        Left eye: No discharge.     Conjunctiva/sclera: Conjunctivae normal.  Neck:     Musculoskeletal: Normal range of motion.  Cardiovascular:     Rate and Rhythm: Normal rate and regular rhythm.     Pulses: Normal pulses.     Heart sounds: Normal heart sounds.  Pulmonary:     Effort: Pulmonary effort is normal.      Breath sounds: Normal breath sounds.  Abdominal:     General: Bowel sounds are normal. There is no distension.     Palpations: Abdomen is soft.     Tenderness: There is no right CVA tenderness, left CVA tenderness, guarding or rebound.     Comments: Tender to left and right lower quadrants.  Genitourinary:    Comments: Chaperone Counselling psychologist present for exam. Digital Rectal Exam reveals sphincter with good tone. No external hemorrhoids. No  masses or fissures. Stool color is brown with no overt blood. No gross melena.  Musculoskeletal: Normal range of motion.  Skin:    General: Skin is warm and dry.  Neurological:     Mental Status: She is oriented to person, place, and time.     Comments: Fluent speech, no facial droop.  Psychiatric:        Mood and Affect: Affect is tearful.        Behavior: Behavior normal.      ED Treatments / Results  Labs (all labs ordered are listed, but only abnormal results are displayed) Labs Reviewed  POC OCCULT BLOOD, ED - Abnormal; Notable for the following components:      Result Value   Fecal Occult Bld POSITIVE (*)    All other components within normal limits  CBC WITH DIFFERENTIAL/PLATELET  COMPREHENSIVE METABOLIC PANEL  URINALYSIS, ROUTINE W REFLEX MICROSCOPIC  LIPASE, BLOOD  PREGNANCY, URINE    EKG None  Radiology No results found.  Procedures Procedures (including critical care time)  Medications Ordered in ED Medications - No data to display   Initial Impression / Assessment and Plan / ED Course  I have reviewed the triage vital signs and the nursing notes.  Pertinent labs & imaging results that were available during my care of the patient were reviewed by me and considered in my medical decision making (see chart for details).  Patient is afebrile, well-appearing, no acute distress.  She is tearful on exam and states that her aunt was recently diagnosed with stage IV colon cancer after having being evaluated for bloody  stool. Her rectal exam is without external hemorrhoids, gross melena and no overt blood seen. DDX for pt's rectal bleeding today include hemorrhoids, diverticular disease, malignancy. Work up today includes CBC, CMP, lipase, UA, urine pregnancy.   Labs are currently pending. At shift change care was transferred to [facility] who will follow pending studies, re-evaulate and determine disposition. If labs are normal, I suspect pt will discharge home with outpatient GI follow up.    Final Clinical Impressions(s) / ED Diagnoses   Final diagnoses:  None    ED Discharge Orders    None       Cherre Robins, PA-C 12/20/18 1534    Pattricia Boss, MD 12/27/18 1455

## 2018-12-20 NOTE — ED Notes (Signed)
Patient verbalizes understanding of discharge instructions . Opportunity for questions and answers were provided . Armband removed by staff ,Pt discharged from ED. W/C  offered at D/C  and Declined W/C at D/C and was escorted to lobby by RN.  

## 2018-12-20 NOTE — ED Triage Notes (Signed)
Pt reports blood  In stool today after several months of constipation. Pt reports taking a laxative

## 2020-03-31 ENCOUNTER — Encounter (HOSPITAL_BASED_OUTPATIENT_CLINIC_OR_DEPARTMENT_OTHER): Payer: Self-pay | Admitting: *Deleted

## 2020-03-31 ENCOUNTER — Emergency Department (HOSPITAL_BASED_OUTPATIENT_CLINIC_OR_DEPARTMENT_OTHER)
Admission: EM | Admit: 2020-03-31 | Discharge: 2020-03-31 | Disposition: A | Discharge status: Home / Self Care | Payer: No Typology Code available for payment source | Attending: Emergency Medicine | Admitting: Emergency Medicine

## 2020-03-31 ENCOUNTER — Other Ambulatory Visit: Payer: Self-pay

## 2020-03-31 ENCOUNTER — Emergency Department (HOSPITAL_BASED_OUTPATIENT_CLINIC_OR_DEPARTMENT_OTHER): Payer: No Typology Code available for payment source

## 2020-03-31 DIAGNOSIS — Z79899 Other long term (current) drug therapy: Secondary | ICD-10-CM | POA: Insufficient documentation

## 2020-03-31 DIAGNOSIS — R079 Chest pain, unspecified: Secondary | ICD-10-CM | POA: Insufficient documentation

## 2020-03-31 DIAGNOSIS — I10 Essential (primary) hypertension: Secondary | ICD-10-CM | POA: Insufficient documentation

## 2020-03-31 DIAGNOSIS — Z87891 Personal history of nicotine dependence: Secondary | ICD-10-CM | POA: Insufficient documentation

## 2020-03-31 DIAGNOSIS — J4520 Mild intermittent asthma, uncomplicated: Secondary | ICD-10-CM | POA: Insufficient documentation

## 2020-03-31 LAB — COMPREHENSIVE METABOLIC PANEL
ALT: 11 U/L (ref 0–44)
AST: 14 U/L — ABNORMAL LOW (ref 15–41)
Albumin: 4.2 g/dL (ref 3.5–5.0)
Alkaline Phosphatase: 57 U/L (ref 38–126)
Anion gap: 9 (ref 5–15)
BUN: 8 mg/dL (ref 6–20)
CO2: 25 mmol/L (ref 22–32)
Calcium: 9 mg/dL (ref 8.9–10.3)
Chloride: 105 mmol/L (ref 98–111)
Creatinine, Ser: 0.7 mg/dL (ref 0.44–1.00)
GFR calc Af Amer: >60 mL/min (ref >60)
GFR calc non Af Amer: >60 mL/min (ref >60)
Glucose, Bld: 94 mg/dL (ref 70–99)
Potassium: 3.3 mmol/L — ABNORMAL LOW (ref 3.5–5.1)
Sodium: 139 mmol/L (ref 135–145)
Total Bilirubin: 1.2 mg/dL (ref 0.3–1.2)
Total Protein: 7.1 g/dL (ref 6.5–8.1)

## 2020-03-31 LAB — CBC WITH DIFFERENTIAL/PLATELET
Abs Immature Granulocytes: 0.01 10*3/uL (ref 0.00–0.07)
Basophils Absolute: 0 10*3/uL (ref 0.0–0.1)
Basophils Relative: 1 %
Eosinophils Absolute: 0.1 10*3/uL (ref 0.0–0.5)
Eosinophils Relative: 1 %
HCT: 34.2 % — ABNORMAL LOW (ref 36.0–46.0)
Hemoglobin: 11.8 g/dL — ABNORMAL LOW (ref 12.0–15.0)
Immature Granulocytes: 0 %
Lymphocytes Relative: 42 %
Lymphs Abs: 1.9 10*3/uL (ref 0.7–4.0)
MCH: 29.6 pg (ref 26.0–34.0)
MCHC: 34.5 g/dL (ref 30.0–36.0)
MCV: 85.7 fL (ref 80.0–100.0)
Monocytes Absolute: 0.3 10*3/uL (ref 0.1–1.0)
Monocytes Relative: 8 %
Neutro Abs: 2.1 10*3/uL (ref 1.7–7.7)
Neutrophils Relative %: 48 %
Platelets: 164 10*3/uL (ref 150–400)
RBC: 3.99 MIL/uL (ref 3.87–5.11)
RDW: 11.2 % — ABNORMAL LOW (ref 11.5–15.5)
WBC: 4.4 10*3/uL (ref 4.0–10.5)
nRBC: 0 % (ref 0.0–0.2)

## 2020-03-31 LAB — D-DIMER, QUANTITATIVE: D-Dimer, Quant: 0.27 ug/mL-FEU (ref 0.00–0.50)

## 2020-03-31 LAB — TROPONIN I (HIGH SENSITIVITY): Troponin I (High Sensitivity): <2 ng/L (ref <18)

## 2020-03-31 MED ORDER — ALBUTEROL SULFATE HFA 108 (90 BASE) MCG/ACT IN AERS
2.0000 | INHALATION_SPRAY | Freq: Once | RESPIRATORY_TRACT | Status: AC
  Administered 2020-03-31: 2 via RESPIRATORY_TRACT
  Filled 2020-03-31: qty 6.7

## 2020-03-31 NOTE — Discharge Instructions (Signed)
The blood test we did to check for your heart and blood clots were normal.  Your EKG and chest x-ray were normal today.  The pain you are experiencing is most likely from the lining of your lungs being irritated.  This may be related to your asthma.  Use the inhaler every 4-6 hours as needed.  You can also take Tylenol and ibuprofen as needed for the pain.

## 2020-03-31 NOTE — ED Provider Notes (Signed)
[facility] EMERGENCY DEPARTMENT Provider Note   CSN: [id number] Arrival date & time: 03/31/20  0945     History Chief Complaint  Patient presents with  . Chest Pain    Shannon Duncan is a 39 y.o. female.  The history is provided by the patient.  Chest Pain Pain location:  Substernal area Pain quality: aching   Pain radiates to:  Does not radiate Pain severity:  Moderate Onset quality:  Gradual Duration:  4 days Timing:  Constant Progression:  Worsening Chronicity:  New Context: breathing   Relieved by:  Nothing Worsened by:  Coughing (seems to be the worse with lying down at night but stress also makes it worse) Ineffective treatments:  None tried Associated symptoms: cough and shortness of breath   Associated symptoms: no abdominal pain, no anorexia, no fever, no headache, no heartburn, no lower extremity edema, no nausea, no near-syncope and no palpitations   Risk factors: no birth control, no coronary artery disease, no diabetes mellitus, no hypertension, no immobilization, not pregnant and no prior DVT/PE   Risk factors comment:  Family hx of DVT/PE      Past Medical History:  Diagnosis Date  . Asthma   . Bipolar 1 disorder ([place])   . Hypertension    PREGNANCY RELATED 9 YEARS AGO  . Panic attack     Patient Active Problem List   Diagnosis Date Noted  . Labor and delivery, indication for care 12/03/2015  . SVD (spontaneous vaginal delivery) 12/03/2015  . Depression 12/03/2015  . AS (sickle cell trait) ([place]) 12/03/2015  . Braxton Hick's contraction 11/08/2015  . [redacted] weeks gestation of pregnancy   . Fetal cystic hygroma   . Hydrops fetalis in first trimester, antepartum     Past Surgical History:  Procedure Laterality Date  . BILATERAL SALPINGECTOMY Bilateral 02/28/2016   Procedure: BILATERAL SALPINGECTOMY;  Surgeon: Crawford Givens, MD;  Location: [facility];  Service: Gynecology;  Laterality: Bilateral;  . DILATION AND EVACUATION N/A 08/24/2014    Procedure: DILATATION AND EVACUATION (D&E) 2ND TRIMESTER;  Surgeon: Marvene Staff, MD;  Location: [facility];  Service: Gynecology;  Laterality: N/A;  1 hr.  . OPERATIVE ULTRASOUND N/A 08/24/2014   Procedure: OPERATIVE ULTRASOUND;  Surgeon: Marvene Staff, MD;  Location: [facility];  Service: Gynecology;  Laterality: N/A;     OB History    Gravida  5   Para  2   Term  1   Preterm  1   AB  3   Living  2     SAB  1   TAB  2   Ectopic      Multiple  0   Live Births  2           No family history on file.  Social History   Tobacco Use  . Smoking status: Former Smoker    Types: Cigarettes  . Smokeless tobacco: Never Used  Substance Use Topics  . Alcohol use: Yes    Alcohol/week: 1.0 standard drink    Types: 1 Glasses of wine per week    Comment: OCCASIONALLY  . Drug use: No    Types: Marijuana    Comment: have not used  since pregnancy    Home Medications Prior to Admission medications   Medication Sig Start Date End Date Taking? Authorizing Provider  ARIPiprazole (ABILIFY) 10 MG tablet Take 10 mg by mouth daily.   Yes [provider]  albuterol (PROVENTIL HFA;VENTOLIN HFA) 108 (90  Base) MCG/ACT inhaler Inhale 1-2 puffs into the lungs every 6 (six) hours as needed for wheezing or shortness of breath.    [provider]  HYDROcodone-acetaminophen (NORCO/VICODIN) 5-325 MG tablet Take 1 tablet by mouth every 6 (six) hours as needed. 02/28/16   Crawford Givens, MD  ibuprofen (ADVIL,MOTRIN) 600 MG tablet Take 1 tablet (600 mg total) by mouth every 6 (six) hours as needed. 02/28/16   Dillard, Gwynneth Munson, MD  LORazepam (ATIVAN) 1 MG tablet Take 1 mg by mouth 4 (four) times daily. Patient says she can take 1mg  up to 4 times a day    [provider]  polyethylene glycol (MIRALAX) packet Take 17 g by mouth daily. 12/20/18   Petrucelli, Glynda Jaeger, PA-C  Prenatal Vit-Fe Fumarate-FA (PRENATAL MULTIVITAMIN) TABS tablet Take 1 tablet by mouth daily at  12 noon.    [provider]  sertraline (ZOLOFT) 25 MG tablet Take 1 tablet (25 mg total) by mouth daily. 12/04/15   Donnel Saxon, CNM    Allergies    Patient has no known allergies.  Review of Systems   Review of Systems  Constitutional: Negative for fever.  Respiratory: Positive for cough and shortness of breath.   Cardiovascular: Positive for chest pain. Negative for palpitations and near-syncope.  Gastrointestinal: Negative for abdominal pain, anorexia, heartburn and nausea.  Neurological: Negative for headaches.  All other systems reviewed and are negative.   Physical Exam Updated Vital Signs BP (!) 118/52 (BP Location: Right Arm)   Pulse 60   Temp 98.2 F (36.8 C) (Oral)   Resp 18   Ht 5' 9.5" (1.765 m)   Wt 56.2 kg   LMP 03/28/2020 (Exact Date)   SpO2 100%   BMI 18.05 kg/m   Physical Exam Vitals and nursing note reviewed.  Constitutional:      General: She is not in acute distress.    Appearance: She is well-developed and underweight.  HENT:     Head: Normocephalic and atraumatic.     Nose: Nose normal.     Mouth/Throat:     Mouth: Mucous membranes are moist.  Eyes:     Conjunctiva/sclera: Conjunctivae normal.     Pupils: Pupils are equal, round, and reactive to light.  Cardiovascular:     Rate and Rhythm: Normal rate and regular rhythm.     Pulses: Normal pulses.     Heart sounds: No murmur heard.   Pulmonary:     Effort: Pulmonary effort is normal. No respiratory distress.     Breath sounds: Normal breath sounds. No wheezing or rales.  Chest:     Chest wall: No tenderness.  Abdominal:     General: There is no distension.     Palpations: Abdomen is soft.     Tenderness: There is no abdominal tenderness. There is no guarding or rebound.  Musculoskeletal:        General: No tenderness. Normal range of motion.     Cervical back: Normal range of motion and neck supple.     Right lower leg: No edema.     Left lower leg: No edema.  Skin:     General: Skin is warm and dry.     Findings: No erythema or rash.  Neurological:     General: No focal deficit present.     Mental Status: She is alert and oriented to person, place, and time. Mental status is at baseline.  Psychiatric:        Mood and Affect: Mood  normal.        Behavior: Behavior normal.        Thought Content: Thought content normal.     ED Results / Procedures / Treatments   Labs (all labs ordered are listed, but only abnormal results are displayed) Labs Reviewed  CBC WITH DIFFERENTIAL/PLATELET - Abnormal; Notable for the following components:      Result Value   Hemoglobin 11.8 (*)    HCT 34.2 (*)    RDW 11.2 (*)    All other components within normal limits  COMPREHENSIVE METABOLIC PANEL - Abnormal; Notable for the following components:   Potassium 3.3 (*)    AST 14 (*)    All other components within normal limits  D-DIMER, QUANTITATIVE (NOT AT [facility])  TROPONIN I (HIGH SENSITIVITY)  TROPONIN I (HIGH SENSITIVITY)    EKG EKG Interpretation  Date/Time:  Friday March 31 2020 10:01:37 EDT Ventricular Rate:  41 PR Interval:    QRS Duration: 91 QT Interval:  436 QTC Calculation: 360 R Axis:   84 Text Interpretation: Sinus bradycardia Consider left ventricular hypertrophy No significant change since last tracing Confirmed by Blanchie Dessert ([id number]) on 03/31/2020 10:42:35 AM   Radiology DG Chest Port 1 View  Result Date: 03/31/2020 CLINICAL DATA:  Chest pain. EXAM: PORTABLE CHEST 1 VIEW COMPARISON:  04/04/2011 chest radiograph. FINDINGS: The heart size and mediastinal contours are within normal limits. Both lungs are clear. The visualized skeletal structures are unremarkable. IMPRESSION: No active disease. Electronically Signed   By: Primitivo Gauze M.D.   On: 03/31/2020 10:46    Procedures Procedures (including critical care time)  Medications Ordered in ED Medications  albuterol (VENTOLIN HFA) 108 (90 Base) MCG/ACT inhaler 2 puff (2 puffs  Inhalation Given 03/31/20 1025)    ED Course  I have reviewed the triage vital signs and the nursing notes.  Pertinent labs & imaging results that were available during my care of the patient were reviewed by me and considered in my medical decision making (see chart for details).    MDM Rules/Calculators/A&P                          Patient is a 39 year old female presenting today with chest pain.  This chest pain is worse with inspiration and she has had nighttime cough.  The pain also seems to be worse with laying down.  Patient does have a history of tobacco use and a prior history of asthma.  She denies any URI symptoms and does not have radiation of pain into her arms or abdomen.  Patient has no abdominal tenderness on exam.  Low suspicion for pneumothorax, dissection, pneumonia.  Patient's EKG shows sinus bradycardia without evidence of ST elevation or symptoms concerning for ACS or pericarditis.  Patient does have family history of clot but patient is otherwise low risk as she is not on OCPs has no tachycardia no immobilization.  Labs including D-dimer, troponin and chest x-ray pending.  Patient does not have wheezing on exam but with nighttime cough, worse with inspiration will have her use inhaler to see if she has any improvement in symptoms.  12:23 PM Patient labs have CBC, CMP, troponin, D-dimer all without acute findings.  X-ray is also within normal limits.  Patient symptoms have improved after using albuterol inhaler.  Suspect this is related to asthma and chest wall pain.  Patient can also take Tylenol ibuprofen as needed for discomfort.  MDM Number of Diagnoses or  Management Options   Amount and/or Complexity of Data Reviewed Clinical lab tests: ordered and reviewed Tests in the radiology section of CPT: ordered and reviewed Tests in the medicine section of CPT: ordered and reviewed Review and summarize past medical records: yes Discuss the patient with other providers:  no Independent visualization of images, tracings, or specimens: yes  Risk of Complications, Morbidity, and/or Mortality Presenting problems: moderate Diagnostic procedures: low Management options: low  Patient Progress Patient progress: improved   Final Clinical Impression(s) / ED Diagnoses Final diagnoses:  Nonspecific chest pain  Mild intermittent asthma, unspecified whether complicated    Rx / DC Orders ED Discharge Orders    None       Blanchie Dessert, MD 03/31/20 1224

## 2020-03-31 NOTE — ED Triage Notes (Signed)
Mid to rt side cp x 3 days,  No sob  States had coughing spell last pm that caused her to be short of breath

## 2021-11-11 IMAGING — DX DG CHEST 1V PORT
1 series · 1 of 1 positions shown · non-contrast
Comparison: 04/04/2011 chest radiograph.

CLINICAL DATA: Chest pain.

EXAM:
PORTABLE CHEST 1 VIEW

[chest ap]
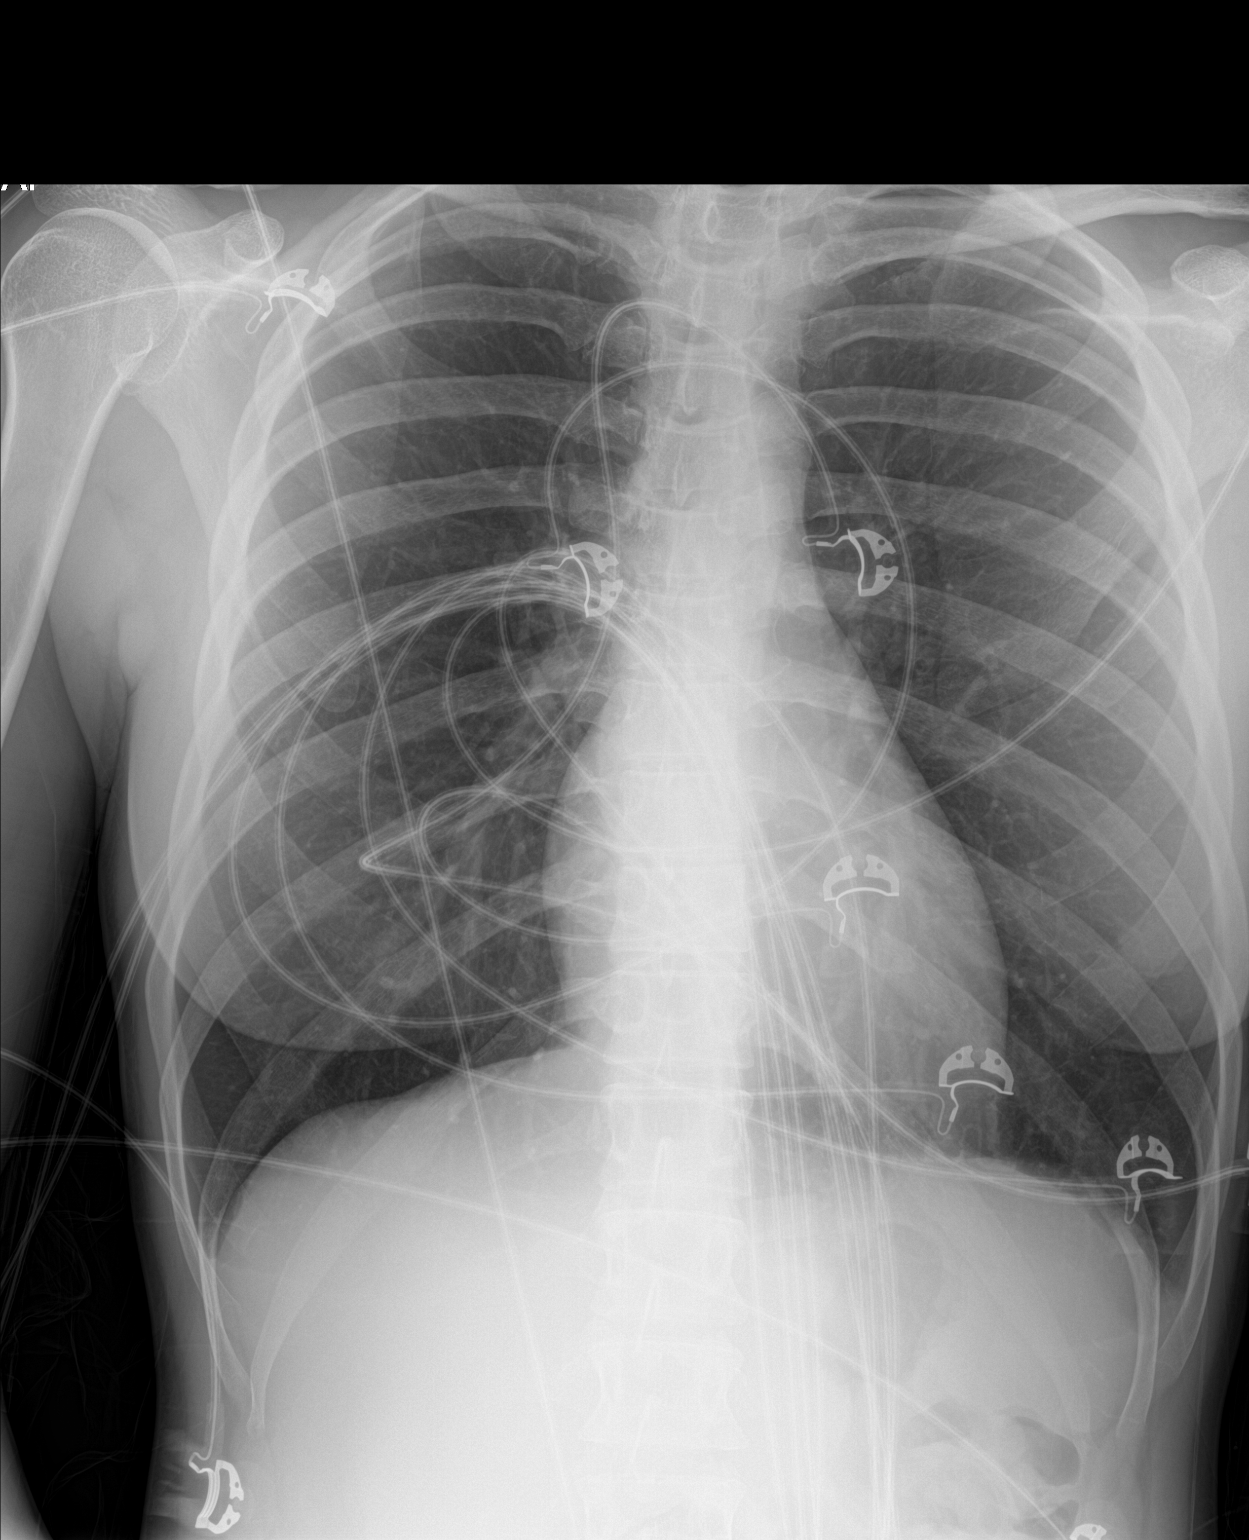

[1 of 1 positions shown; findings below may reference images not displayed]

FINDINGS: The heart size and mediastinal contours are within normal limits.
Both lungs are clear. The visualized skeletal structures are
unremarkable.
IMPRESSION: No active disease.
